# Patient Record
Sex: Male | Born: 1940 | Hispanic: Yes | Marital: Married | Smoking: Never smoker
Health system: Southern US, Community
[De-identification: ages and names within clinical notes are randomized; demographics above are authoritative.]

## PROBLEM LIST (undated history)

## (undated) DIAGNOSIS — N400 Enlarged prostate without lower urinary tract symptoms: Secondary | ICD-10-CM

## (undated) DIAGNOSIS — I10 Essential (primary) hypertension: Secondary | ICD-10-CM

## (undated) DIAGNOSIS — R350 Frequency of micturition: Secondary | ICD-10-CM

## (undated) DIAGNOSIS — R972 Elevated prostate specific antigen [PSA]: Secondary | ICD-10-CM

## (undated) DIAGNOSIS — R55 Syncope and collapse: Secondary | ICD-10-CM

## (undated) DIAGNOSIS — R42 Dizziness and giddiness: Secondary | ICD-10-CM

## (undated) HISTORY — DX: Frequency of micturition: R35.0

## (undated) HISTORY — DX: Syncope and collapse: R55

## (undated) HISTORY — DX: Essential (primary) hypertension: I10

## (undated) HISTORY — DX: Elevated prostate specific antigen (PSA): R97.20

## (undated) HISTORY — DX: Benign prostatic hyperplasia without lower urinary tract symptoms: N40.0

## (undated) HISTORY — DX: Dizziness and giddiness: R42

## (undated) HISTORY — PX: PROSTATE BIOPSY: SHX241

---

## 2010-08-30 HISTORY — PX: KNEE ASPIRATION: SHX1892

## 2010-08-30 HISTORY — PX: INGUINAL HERNIA REPAIR: SHX194

## 2010-08-30 HISTORY — PX: OTHER SURGICAL HISTORY: SHX169

## 2010-12-02 ENCOUNTER — Other Ambulatory Visit: Payer: Self-pay | Admitting: General Surgery

## 2010-12-02 ENCOUNTER — Encounter (HOSPITAL_COMMUNITY): Payer: Self-pay | Attending: General Surgery

## 2010-12-02 DIAGNOSIS — K4020 Bilateral inguinal hernia, without obstruction or gangrene, not specified as recurrent: Secondary | ICD-10-CM | POA: Insufficient documentation

## 2010-12-02 DIAGNOSIS — Z01812 Encounter for preprocedural laboratory examination: Secondary | ICD-10-CM | POA: Insufficient documentation

## 2010-12-02 LAB — CBC
HCT: 40.5 % (ref 39.0–52.0)
MCHC: 33.1 g/dL (ref 30.0–36.0)
MCV: 86.7 fL (ref 78.0–100.0)
Platelets: 276 10*3/uL (ref 150–400)
RDW: 13.7 % (ref 11.5–15.5)

## 2010-12-02 LAB — BASIC METABOLIC PANEL
BUN: 18 mg/dL (ref 6–23)
Calcium: 9.1 mg/dL (ref 8.4–10.5)
Creatinine, Ser: 0.85 mg/dL (ref 0.4–1.5)
GFR calc non Af Amer: >60 mL/min (ref >60)
Glucose, Bld: 129 mg/dL — ABNORMAL HIGH (ref 70–99)

## 2010-12-02 LAB — SURGICAL PCR SCREEN: Staphylococcus aureus: POSITIVE — AB

## 2010-12-08 ENCOUNTER — Ambulatory Visit (HOSPITAL_COMMUNITY)
Admission: RE | Admit: 2010-12-08 | Discharge: 2010-12-09 | Disposition: A | Discharge status: Home / Self Care | Payer: Self-pay | Source: Ambulatory Visit | Attending: General Surgery | Admitting: General Surgery

## 2010-12-08 DIAGNOSIS — K4020 Bilateral inguinal hernia, without obstruction or gangrene, not specified as recurrent: Secondary | ICD-10-CM | POA: Insufficient documentation

## 2010-12-23 NOTE — Op Note (Signed)
Charles Cantu, Charles Cantu           ACCOUNT NO.:  [id number]  MEDICAL RECORD NO.:  [id number]           PATIENT TYPE:  I  LOCATION:  A341                          FACILITY:  [facility]  PHYSICIAN:  Barbaraann Barthel, M.D. DATE OF BIRTH:  08-13-1941  DATE OF PROCEDURE:  12/08/2010 DATE OF DISCHARGE:                              OPERATIVE REPORT   SURGEON:  Barbaraann Barthel, MD  PREOPERATIVE DIAGNOSIS:  Bilateral inguinal hernias (direct).  POSTOPERATIVE DIAGNOSIS:  Bilateral inguinal hernias (direct).  PROCEDURE:  Bilateral inguinal herniorrhaphies (modified McVay repair without mesh).  WOUND CLASSIFICATION:  Clean.  SPECIMEN:  None.  NOTE:  This is a 69 year old [place] who presented with bilateral inguinal hernias.  We discussed repair, discussing complications not limited to but including bleeding, infection, and recurrence.  This was all explained to him in Spanish and informed consent was obtained.  PROCEDURE:  The patient was placed in the supine position after the adequate administration of spinal anesthesia.  A Foley catheter was aseptically inserted and his abdomen was prepped with Betadine solution and draped in the usual manner.  We started on the right side making an incision between the anterior-superior iliac spine and the pubic tubercle on the right side through skin, subcutaneous tissue and Scarpa's layer down to the external oblique, which was opened through the external ring.  The external oblique was then opened.  The cord structures were dissected free.  There was a direct hernia defect noted. There was no indirect component.  The cord structures were then dissected free from the hernia sac and the hernia sac was then invaginated within the peritoneal cavity, this was not opened and we then repaired the hernia suturing transversus abdominis and transversalis fascia to Cooper's ligament and Poupart's ligament using interrupted 2-0 Bralon sutures.  Prior to  cinching these, relaxing incision was carried out.  We then used 0.5% Sensorcaine to help with postoperative comfort and then returned the cord structures to their anatomic position and repaired the external oblique over the cord structures using a running 3-0 Polysorb suture.  The wound was irrigated with normal saline solution.  The skin was approximated with stapling device and isolated with a sterile OpSite dressing.  We then turned attention to the left side where pretty much the same procedure was repeated.  We did have a little larger hernia noted on this side.  We made an incision between the anterior-superior iliac spine and the pubic tubercle here through skin, subcutaneous tissue and Scarpa's layer through the external oblique, which was opened through the external ring.  The cord structures were then dissected free from the indirect hernia sac.  This was again invaginated within the abdominal cavity and then I closed the transversus abdominis and transversalis fascia suturing this to Cooper's ligament and Poupart's ligament repairing the hernia thusly.  Prior to cinching these tightly, a relaxing incision was carried out.  I had then used 0.5% Sensorcaine approximately 10 mL on this side as well to help with postoperative comfort.  The cord structures were returned to their anatomic position and the external oblique was repaired over them with a running 3-0 Polysorb suture.  The  subcu was irrigated with normal saline solution.  The skin was approximated with stapling device.  A sterile OpSite dressing was applied.  Prior to closure, all sponge, needle and instrument counts were found to be correct.  Estimated blood loss was minimal, less than 25 mL.  The patient received approximately 650 mL of crystalloids intraoperatively.  No drains were placed and there were no complications.  The patient was taken to the recovery room in satisfactory condition.  The Foley was left in  place because of spinal anesthesia and a history of BPH.  The patient will be admitted for observation.     Barbaraann Barthel, M.D.     WB/MEDQ  D:  12/08/2010  T:  12/08/2010  Job:  [id number]  Electronically Signed by Barbaraann Barthel M.D. on 12/23/2010 03:27:42 PM

## 2010-12-23 NOTE — Discharge Summary (Signed)
  NAMELONDEN, BOK           ACCOUNT NO.:  [id number]  MEDICAL RECORD NO.:  [id number]           PATIENT TYPE:  O  LOCATION:  A341                          FACILITY:  [facility]  PHYSICIAN:  Barbaraann Barthel, M.D. DATE OF BIRTH:  1940-12-25  DATE OF ADMISSION:  12/08/2010 DATE OF DISCHARGE:  04/11/2012LH                              DISCHARGE SUMMARY   DIAGNOSIS:  Bilateral inguinal hernias.  PROCEDURE:  On December 08, 2010, bilateral inguinal herniorrhaphies (modified McVay repair, no mesh used).  SECONDARY DIAGNOSIS:  Benign prostatic hypertrophy.  PROCEDURE NOTE:  This is a 70 year old [place] who presented with bilateral inguinal hernias.  We discussed repair with him in Spanish and planned for an outpatient at admission.  We took care of both of these hernias at the same time.  Postoperatively, he did very well.  He had some incisional discomfort, but his wound was clean.  He was voiding well.  He had no problems with his spinal anesthesia, and he was discharged the following day after a period of observation.  He was given a prescription for Colace 100 mg daily or every other day to take for avoiding straining of the bowels and Percocet 1 tablet 5/325 to take q.4-6 h. for pain.  He is to continue the rest of his medications and that he usually takes at home.  We will follow him up perioperatively. The family is instructed to contact me should he have any acute changes and we will follow up with him on December 16, 2010, at 10 a.m.  He is discharged on a regular diet.  He is told to clean his wound with alcohol and use an ice pack as needed for comfort and then he is told to do no heavy lifting greater than 5 pounds and he is told to refrain from going into the swimming pool or any Jacuzzis or anything or swimming until we remove his staples.  He is told to do no driving, no sexual activity, and to increase his activities as tolerated and he is excused from work.  We will  follow up with him and he is told to come to the emergency room or call me should there be any acute changes.     Barbaraann Barthel, M.D.     WB/MEDQ  D:  12/09/2010  T:  12/10/2010  Job:  [id number]  Electronically Signed by Barbaraann Barthel M.D. on 12/23/2010 03:26:52 PM

## 2011-07-13 ENCOUNTER — Encounter: Payer: Self-pay | Admitting: Orthopedic Surgery

## 2011-07-13 ENCOUNTER — Ambulatory Visit (INDEPENDENT_AMBULATORY_CARE_PROVIDER_SITE_OTHER): Payer: Self-pay | Admitting: Orthopedic Surgery

## 2011-07-13 VITALS — BP 120/70 | Ht 60.0 in | Wt 131.0 lb

## 2011-07-13 DIAGNOSIS — M009 Pyogenic arthritis, unspecified: Secondary | ICD-10-CM

## 2011-07-13 LAB — SYNOVIAL CELL COUNT + DIFF, W/ CRYSTALS
Crystals, Fluid: NONE SEEN
Lymphocytes-Synovial Fld: 5 % (ref 0–20)
Neutrophil, Synovial: 80 % — ABNORMAL HIGH (ref 0–25)
WBC, Synovial: 28500 cu mm — ABNORMAL HIGH (ref 0–200)

## 2011-07-13 MED ORDER — HYDROCODONE-ACETAMINOPHEN 5-325 MG PO TABS: 1.0000 | ORAL_TABLET | Freq: Four times a day (QID) | ORAL | Status: DC | PRN

## 2011-07-13 NOTE — Patient Instructions (Addendum)
APPLY ICE AS NEEDED   WEAR BRACE   TAKE MEDICATION   RETURN IN  2 DAYS

## 2011-07-15 ENCOUNTER — Encounter: Payer: Self-pay | Admitting: Orthopedic Surgery

## 2011-07-15 ENCOUNTER — Ambulatory Visit (INDEPENDENT_AMBULATORY_CARE_PROVIDER_SITE_OTHER): Payer: Self-pay | Admitting: Orthopedic Surgery

## 2011-07-15 VITALS — BP 120/62 | Ht 60.0 in | Wt 131.0 lb

## 2011-07-15 DIAGNOSIS — M009 Pyogenic arthritis, unspecified: Secondary | ICD-10-CM

## 2011-07-15 NOTE — Progress Notes (Signed)
Visit status post aspiration RIGHT knee  Cultures were obtained and cell count was also obtained showing approximately 30,000 white cells in the knee with gram-positive cocci but no growth x1 day  Patient was on antibiotics and his knee aspirated 2 days ago.  He comes in walking unsupported today with decrease in pain.  Clinically has decreased redness improved range of motion  He still has joint effusion and the aspiration was repeated.  Today his effusion is much clearer with clear yellow fluid and less fluid of only 30 cc.  Recommend continued antibiotics orally return on Monday or Tuesday for recheck of the knee and a final culture result.  Aspiration RIGHT knee Verbal consent and timeout were completed for a RIGHT knee aspiration  Under sterile conditions the RIGHT knee was aspirated from a lateral approach.  Findings were clear yellow fluid 30 cc  A sterile dressing was applied there were no complications

## 2011-07-15 NOTE — Patient Instructions (Signed)
CONTINUE ANTIBIOTICS  

## 2011-07-19 ENCOUNTER — Encounter: Payer: Self-pay | Admitting: Orthopedic Surgery

## 2011-07-19 DIAGNOSIS — M009 Pyogenic arthritis, unspecified: Secondary | ICD-10-CM | POA: Insufficient documentation

## 2011-07-19 NOTE — Progress Notes (Signed)
Chief complaint: right knee pain  HPI:(4) This patient was referred to me by Dr. Malvin Johns.  On Sunday, November 11 patient had a torn go into his RIGHT knee presents on the 12th with pain and swelling and is here today for increased pain swelling sharp dull throbbing pain 8/10 constant difficulty with ambulation.  He was started on doxycycline 100 mg q. Day for 14 days.  ROS:(2) Negative review of systems except for the muscle pain and swelling  PFSH: (1) History reviewed. No pertinent past medical history.   Physical Exam(12) GENERAL: normal development   CDV: pulses are normal   Skin: normal  Lymph: nodes were not palpable/normal  Psychiatric: awake, alert and oriented  Neuro: normal sensation  MSK He has an obvious limp 1  Large joint effusion, loss of full range of motion, knee stable.  Strength normal.  Puncture wound over the lateral joint. 2 Pulse and temperature normal 3 No evidence of lymphangitis or lymphadenopathy 4 Sensation normal 5 Reflexes are deferred 6 Balance is normal Imaging: Separate x-ray report for his knee effusion possible septic joint A low and a joint effusion is seen there is no evidence of fracture or dislocation  Impression joint effusion normal bone  Assessment: Septic arthritis    Plan: Aspiration, culture and cell count  Aspiration RIGHT knee Verbal consent and timeout were completed for a RIGHT knee aspiration  Under sterile conditions the RIGHT knee was aspirated from a lateral approach.  Findings were Cloudy fluid 60 cc A sterile dressing was applied there were no complications

## 2011-07-20 ENCOUNTER — Ambulatory Visit (INDEPENDENT_AMBULATORY_CARE_PROVIDER_SITE_OTHER): Payer: Self-pay | Admitting: Orthopedic Surgery

## 2011-07-20 ENCOUNTER — Encounter: Payer: Self-pay | Admitting: Orthopedic Surgery

## 2011-07-20 VITALS — BP 150/80 | Ht 60.0 in | Wt 131.0 lb

## 2011-07-20 DIAGNOSIS — M009 Pyogenic arthritis, unspecified: Secondary | ICD-10-CM

## 2011-07-20 MED ORDER — DOXYCYCLINE MONOHYDRATE 100 MG PO CAPS: 100.0000 mg | ORAL_CAPSULE | Freq: Two times a day (BID) | ORAL | Status: DC

## 2011-07-20 MED ORDER — HYDROCODONE-ACETAMINOPHEN 5-325 MG PO TABS: 1.0000 | ORAL_TABLET | Freq: Four times a day (QID) | ORAL | Status: AC | PRN

## 2011-07-20 NOTE — Patient Instructions (Signed)
RETURN 1 WEEK   TAKE ANTIBIOTIC 2 X A DAY

## 2011-07-20 NOTE — Progress Notes (Signed)
Followup status post aspiration of RIGHT knee for presumed septic arthritis. Cultures are negative after 3 days, presumed cellulitis with sympathetic effusion of the RIGHT knee. Patient complains of pain, increasing since Saturday on the medial side of the knee.  The patient also had a rash on the knee.  The RIGHT knee does have an effusion. He is painful extension and flexion, although passive range of motion was normal. The joint is stable. Muscle tone is excellent. Neurovascular exam is intact. Skin does show some red areas around the knee joint.  Aspiration of clear fluid began approximately 25 cc.  Continue doxycycline 100 mg twice a day and hydrocodone 5 mg q.6 as needed. Follow up one week to reevaluate the knee  Aspiration RIGHT knee Verbal consent and timeout were completed for a RIGHT knee aspiration  Under sterile conditions the RIGHT knee was aspirated from a lateral approach.  Findings were clear yellow fluid 30 cc  A sterile dressing was applied there were no complications

## 2011-07-27 ENCOUNTER — Encounter: Payer: Self-pay | Admitting: Orthopedic Surgery

## 2011-07-27 ENCOUNTER — Ambulatory Visit (INDEPENDENT_AMBULATORY_CARE_PROVIDER_SITE_OTHER): Payer: Self-pay | Admitting: Orthopedic Surgery

## 2011-07-27 VITALS — BP 110/70 | Ht 60.0 in | Wt 131.0 lb

## 2011-07-27 DIAGNOSIS — M009 Pyogenic arthritis, unspecified: Secondary | ICD-10-CM

## 2011-07-27 NOTE — Progress Notes (Signed)
Followup visit. RIGHT knee.  Status post multiple aspiration and culture negative, but presumed infection.  Currently, on doxycycline.  Now pain free.  Knee looks good today. No effusion. Full range of motion, normal ambulation without any evidence of  Recommend continue on antibiotics. Return to neutral. Recheck.

## 2011-07-27 NOTE — Patient Instructions (Signed)
Finish antibiotics

## 2011-08-10 ENCOUNTER — Ambulatory Visit (INDEPENDENT_AMBULATORY_CARE_PROVIDER_SITE_OTHER): Payer: Self-pay | Admitting: Orthopedic Surgery

## 2011-08-10 VITALS — Ht 60.0 in | Wt 131.0 lb

## 2011-08-10 DIAGNOSIS — M009 Pyogenic arthritis, unspecified: Secondary | ICD-10-CM

## 2011-08-10 NOTE — Patient Instructions (Signed)
As tolerated

## 2011-08-11 ENCOUNTER — Encounter: Payer: Self-pay | Admitting: Orthopedic Surgery

## 2011-08-11 NOTE — Progress Notes (Signed)
Follow-up, status post multiple aspirations and oral antibiotics. Patient is asymptomatic.  Exam shows no effusion. Full range of motion. The skin rash has resolved as well.  He is allowed to do normal activities.

## 2014-10-16 ENCOUNTER — Encounter: Payer: Self-pay | Admitting: Cardiology

## 2014-10-16 ENCOUNTER — Ambulatory Visit (INDEPENDENT_AMBULATORY_CARE_PROVIDER_SITE_OTHER): Payer: Self-pay | Admitting: Cardiology

## 2014-10-16 VITALS — BP 134/68 | HR 75 | Ht <= 58 in | Wt 141.2 lb

## 2014-10-16 DIAGNOSIS — R42 Dizziness and giddiness: Secondary | ICD-10-CM | POA: Insufficient documentation

## 2014-10-16 DIAGNOSIS — I10 Essential (primary) hypertension: Secondary | ICD-10-CM

## 2014-10-16 NOTE — Progress Notes (Signed)
Cardiology Office Note  Date: 10/16/2014   ID: Charles FoilHoracio Wardrop, DOB [birth year], MRN [id number]  PCP: Marlane HatcherBRADFORD,WILLIAM S, MD  Primary Cardiologist: Nona DellSamuel Merrit Waugh, MD   Chief Complaint  Patient presents with  . Dizziness    History of Present Illness: Charles Cantu is a 74 y.o. male referred for cardiology consultation by Dr. Malvin JohnsBradford. He is here today with a family member and also a [place] interpreter. He states that over the last month he has experienced episode of vertigo, specifically describing a spinning sensation, when he turns his head suddenly. This is most notable when he is lying down in bed and turns his head to the side quickly. It also occurs to milder degree when he bends over and stands back up quickly. At no point has he experienced any chest pain, palpitations, or syncope. Usually the symptoms last only for a few seconds. He does not recall any recent viral infections, no sinus congestion. He also states that he has a sudden, sharp pain in his years sometimes. Questionable popping sensation.  He has been on Norvasc which is a new medication in the last few weeks. He states that the symptoms have not been any worse on this medication, and preceded its initiation.  ECG done today is reviewed below. He is not aware of any personal cardiac history, no arrhythmias.  Orthostatic measurements made today were normal.   Past Medical History  Diagnosis Date  . Benign prostatic hypertrophy   . Essential hypertension     Past Surgical History  Procedure Laterality Date  . Cataract surgery Right 2012  . Inguinal hernia repair Bilateral 2012  . Knee aspiration Right 2012    Current Outpatient Prescriptions  Medication Sig Dispense Refill  . amLODipine (NORVASC) 5 MG tablet Take 5 mg by mouth daily.     No current facility-administered medications for this visit.    Allergies:  Review of patient's allergies indicates no known allergies.   Social History:  The patient  reports that he has never smoked. He does not have any smokeless tobacco history on file. He reports that he drinks alcohol. He reports that he does not use illicit drugs.   Family History: The patient's family history was reviewed and patient states of no major chronic medical conditions in first-degree relatives to the best that he can recall.  ROS:  Please see the history of present illness. Otherwise, complete review of systems is positive for none.  All other systems are reviewed and negative.    Physical Exam: VS:  BP 134/68 mmHg  Pulse 75  Ht 4\' 10"  (1.473 m)  Wt 141 lb 3.2 oz (64.048 kg)  BMI 29.52 kg/m2  SpO2 95%, BMI Body mass index is 29.52 kg/(m^2).  Wt Readings from Last 3 Encounters:  10/16/14 141 lb 3.2 oz (64.048 kg)  08/10/11 131 lb (59.421 kg)  07/27/11 131 lb (59.421 kg)     General: Patient appears comfortable at rest. HEENT: Conjunctiva and lids normal, oropharynx clear with moist mucosa. Neck: Supple, no elevated JVP or carotid bruits, no thyromegaly. Lungs: Clear to auscultation, nonlabored breathing at rest. Cardiac: Regular rate and rhythm, soft S4, no significant systolic murmur, no pericardial rub. Abdomen: Soft, nontender, bowel sounds present, no guarding or rebound. Extremities: No pitting edema, distal pulses 2+. Skin: Warm and dry. Musculoskeletal: No kyphosis. Neuropsychiatric: Alert and oriented x3, affect grossly appropriate.   ECG: ECG is ordered today and reviewed finding sinus rhythm with nonspecific ST changes and lead motion  artifact.  Recent Labwork:  Lab work from January showed potassium 4.1, BUN 15, creatinine 0.7, cholesterol 263, triglycerides 296, HDL 35, LDL 169, AST 71, ALT 96, TSH 2.7, hemoglobin 15.5, platelets 280.   ASSESSMENT AND PLAN:  1. Patient describing symptoms consistent with vertigo. Could perhaps be related to inner ear imbalance, viral labyrinthitis, or even benign positional vertigo. Onset was  approximately one month ago, he states that the symptoms are getting somewhat better. He has had no palpitations, chest pain, or syncope. He was not orthostatic on examination today, and there does not appear to be any specific correlation with recent initiation of Norvasc. I recommended over-the-counter meclizine as needed, keep follow-up with Dr. Malvin Johns. If symptoms persist or worsen, would recommend referral for ENT evaluation. Physical therapy maneuvers are sometimes helpful in improving persistent symptoms. No further cardiac testing arranged at this time.  2. Essential hypertension, recently started on Norvasc. Keep follow-up with Dr. Malvin Johns.  3. Hyperlipidemia, LDL 169 based on recent lab work. Currently not on any specific medical therapy. Keep follow-up with Dr. Malvin Johns.  4. Abnormal AST and ALT, etiology not certain. Patient reports only occasional alcohol use, no other known liver disease. Keep follow-up with Dr. Malvin Johns.  Current medicines are reviewed at length with the patient today.  The patient does not have concerns regarding medicines.   Orders Placed This Encounter  Procedures  . EKG 12-Lead    Disposition: FU with me as needed.   Signed, Jonelle Sidle, MD, [facility] 10/16/2014 11:11 AM    [facility] [address], [place], [place] [address] Phone: [phone]; Fax: [phone]

## 2014-10-16 NOTE — Patient Instructions (Signed)
Your physician recommends that you schedule a follow-up appointment As needed  Your physician recommends that you continue on your current medications as directed. Please refer to the Current Medication list given to you today.  Thank you for choosing [facility]!    

## 2014-10-18 ENCOUNTER — Encounter: Payer: Self-pay | Admitting: Cardiovascular Disease

## 2015-08-23 ENCOUNTER — Encounter (HOSPITAL_COMMUNITY): Payer: Self-pay | Admitting: Emergency Medicine

## 2015-08-23 ENCOUNTER — Emergency Department (HOSPITAL_COMMUNITY): Payer: Self-pay

## 2015-08-23 ENCOUNTER — Inpatient Hospital Stay (HOSPITAL_COMMUNITY)
Admission: EM | Admit: 2015-08-23 | Discharge: 2015-08-28 | DRG: 872 | Disposition: A | Discharge status: Home Health | Payer: Self-pay | Attending: Internal Medicine | Admitting: Internal Medicine

## 2015-08-23 DIAGNOSIS — B9620 Unspecified Escherichia coli [E. coli] as the cause of diseases classified elsewhere: Secondary | ICD-10-CM | POA: Diagnosis present

## 2015-08-23 DIAGNOSIS — Z1612 Extended spectrum beta lactamase (ESBL) resistance: Secondary | ICD-10-CM | POA: Diagnosis present

## 2015-08-23 DIAGNOSIS — R338 Other retention of urine: Secondary | ICD-10-CM | POA: Diagnosis present

## 2015-08-23 DIAGNOSIS — Z9079 Acquired absence of other genital organ(s): Secondary | ICD-10-CM

## 2015-08-23 DIAGNOSIS — A498 Other bacterial infections of unspecified site: Secondary | ICD-10-CM | POA: Diagnosis present

## 2015-08-23 DIAGNOSIS — N410 Acute prostatitis: Secondary | ICD-10-CM | POA: Diagnosis present

## 2015-08-23 DIAGNOSIS — N401 Enlarged prostate with lower urinary tract symptoms: Secondary | ICD-10-CM | POA: Diagnosis present

## 2015-08-23 DIAGNOSIS — A419 Sepsis, unspecified organism: Principal | ICD-10-CM | POA: Diagnosis present

## 2015-08-23 DIAGNOSIS — I10 Essential (primary) hypertension: Secondary | ICD-10-CM | POA: Diagnosis present

## 2015-08-23 DIAGNOSIS — N390 Urinary tract infection, site not specified: Secondary | ICD-10-CM | POA: Diagnosis present

## 2015-08-23 LAB — CBC WITH DIFFERENTIAL/PLATELET
BASOS ABS: 0 10*3/uL (ref 0.0–0.1)
Basophils Relative: 0 %
EOS ABS: 0.1 10*3/uL (ref 0.0–0.7)
EOS PCT: 1 %
HCT: 44.1 % (ref 39.0–52.0)
HEMOGLOBIN: 15 g/dL (ref 13.0–17.0)
LYMPHS ABS: 0.9 10*3/uL (ref 0.7–4.0)
Lymphocytes Relative: 5 %
MCH: 29.8 pg (ref 26.0–34.0)
MCHC: 34 g/dL (ref 30.0–36.0)
MCV: 87.7 fL (ref 78.0–100.0)
Monocytes Absolute: 0.9 10*3/uL (ref 0.1–1.0)
Monocytes Relative: 5 %
NEUTROS PCT: 89 %
Neutro Abs: 14.8 10*3/uL — ABNORMAL HIGH (ref 1.7–7.7)
PLATELETS: 223 10*3/uL (ref 150–400)
RBC: 5.03 MIL/uL (ref 4.22–5.81)
RDW: 13.6 % (ref 11.5–15.5)
WBC: 16.7 10*3/uL — AB (ref 4.0–10.5)

## 2015-08-23 MED ORDER — IBUPROFEN 100 MG/5ML PO SUSP
600.0000 mg | Freq: Once | ORAL | Status: AC
  Administered 2015-08-24: 600 mg via ORAL
  Filled 2015-08-23: qty 30

## 2015-08-23 MED ORDER — SODIUM CHLORIDE 0.9 % IV BOLUS (SEPSIS)
1000.0000 mL | Freq: Once | INTRAVENOUS | Status: AC
  Administered 2015-08-24: 1000 mL via INTRAVENOUS

## 2015-08-23 MED ORDER — FENTANYL CITRATE (PF) 100 MCG/2ML IJ SOLN
50.0000 ug | Freq: Once | INTRAMUSCULAR | Status: AC
  Administered 2015-08-24: 50 ug via INTRAVENOUS
  Filled 2015-08-23: qty 2

## 2015-08-23 NOTE — ED Notes (Signed)
Pt had prostate biopsy yesterday, started running a low grade fever today. C/O post op pain.

## 2015-08-23 NOTE — ED Provider Notes (Signed)
CSN: [id number]     Arrival date & time 08/23/15  2256 History  By signing my name below, I, Charles Cantu, attest that this documentation has been prepared under the direction and in the presence of Devoria Albe, MD. Electronically Signed: Angelene Giovanni, ED Scribe. 08/23/2015. 11:30 PM.    Chief Complaint  Patient presents with  . Post-op Problem   The history is provided by the patient. No language interpreter was used.   HPI Comments: Charles Cantu is a 74 y.o. male who presents to the Emergency Department complaining of post op pain onset today s/p prostate biopsy performed at [facility] yesterday, Dec 23rd. Pt reports associated low grade fever, chills today, pain with urination, rectal pain, and mild SOB. He adds that he had hematuria yesterday. He also reports associated gradually worsening waxing and weaning right sided CP onset this am, worse with palpation or ROM of his right arm. He denies any similar CP in the past. He denies lifting anything heavy recently or any change in activity. His daughter deny any problems during the biopsy when pt was laying on his left side. Pt was able to eat today and he took 2 Tylenol at 6 pm and Cipro today with mild relief of fever. He started cipro on Dec 22 and finished his last pill earlier tonight (total of 6 pills in 3 days). Pt is not currently on any prescription medicine. Marland Kitchen He denies any abdominal pain, n/v or cough. His daughter and son are here to interpret.   No PCP since Dr. Malvin Johns.  Pt only goes to [facility] for Urology.    Past Medical History  Diagnosis Date  . Benign prostatic hypertrophy   . Essential hypertension    Past Surgical History  Procedure Laterality Date  . Cataract surgery Right 2012  . Inguinal hernia repair Bilateral 2012  . Knee aspiration Right 2012  . Prostate biopsy     Family History  Problem Relation Age of Onset  . Family history unknown: Yes   Social History  Substance Use Topics  .  Smoking status: Never Smoker   . Smokeless tobacco: None  . Alcohol Use: 0.0 oz/week    0 Standard drinks or equivalent per week     Comment: Occasional beer  lives with sone  Review of Systems  Constitutional: Positive for fever and chills.  Respiratory: Positive for shortness of breath. Negative for cough.   Cardiovascular: Positive for chest pain.  Gastrointestinal: Positive for rectal pain. Negative for nausea, vomiting, abdominal pain and diarrhea.  Genitourinary: Positive for hematuria.  All other systems reviewed and are negative.     Allergies  Review of patient's allergies indicates no known allergies.  Home Medications   Prior to Admission medications   Medication Sig Start Date End Date Taking? Authorizing Provider  ciprofloxacin (CIPRO) 500 MG tablet Take 500 mg by mouth 3 (three) times daily. Completed on 08/23/2015-3 day course started 08/19/15    Historical Provider, MD   BP 147/81 mmHg  Pulse 100  Temp(Src) 100 F (37.8 C) (Oral)  Resp 20  Ht 5' (1.524 m)  Wt 139 lb (63.05 kg)  BMI 27.15 kg/m2  SpO2 96%  Vital signs normal except low grade fever, borderline tachycardia  Physical Exam  Constitutional: He is oriented to person, place, and time. He appears well-developed and well-nourished.  Non-toxic appearance. He does not appear ill. No distress.  HENT:  Head: Normocephalic and atraumatic.  Right Ear: External ear normal.  Left Ear:  External ear normal.  Nose: Nose normal. No mucosal edema or rhinorrhea.  Mouth/Throat: Oropharynx is clear and moist and mucous membranes are normal. No dental abscesses or uvula swelling.  Eyes: Conjunctivae and EOM are normal. Pupils are equal, round, and reactive to light.  Neck: Normal range of motion and full passive range of motion without pain. Neck supple.  Cardiovascular: Normal rate, regular rhythm and normal heart sounds.  Exam reveals no gallop and no friction rub.   No murmur heard. Pulmonary/Chest: Effort  normal and breath sounds normal. No respiratory distress. He has no wheezes. He has no rhonchi. He has no rales. He exhibits tenderness. He exhibits no crepitus.    Tender especially the right superior lateral chest, very tender to touch.   Abdominal: Soft. Normal appearance and bowel sounds are normal. He exhibits no distension. There is no tenderness. There is no rebound and no guarding.  Musculoskeletal: Normal range of motion. He exhibits no edema or tenderness.  Moves all extremities well.   Neurological: He is alert and oriented to person, place, and time. He has normal strength. No cranial nerve deficit.  Skin: Skin is warm, dry and intact. No rash noted. No erythema. No pallor.  Psychiatric: He has a normal mood and affect. His speech is normal and behavior is normal. His mood appears not anxious.  Nursing note and vitals reviewed.   ED Course  Procedures (including critical care time)  Medications  0.9 %  sodium chloride infusion ( Intravenous New Bag/Given 08/24/15 0331)  morphine 2 MG/ML injection 2-4 mg (not administered)  heparin injection 5,000 Units (not administered)  sodium chloride 0.9 % injection 3 mL (3 mLs Intravenous Not Given 08/24/15 0331)  piperacillin-tazobactam (ZOSYN) IVPB 3.375 g (3.375 g Intravenous Given 08/24/15 0332)  sodium chloride 0.9 % bolus 1,000 mL (0 mLs Intravenous Stopped 08/24/15 0144)  fentaNYL (SUBLIMAZE) injection 50 mcg (50 mcg Intravenous Given 08/24/15 0030)  ibuprofen (ADVIL,MOTRIN) 100 MG/5ML suspension 600 mg (600 mg Oral Given 08/24/15 0027)  iohexol (OMNIPAQUE) 300 MG/ML solution 100 mL (100 mLs Intravenous Contrast Given 08/24/15 0012)  cefTRIAXone (ROCEPHIN) 1 g in dextrose 5 % 50 mL IVPB (0 g Intravenous Stopped 08/24/15 0139)  cefTRIAXone (ROCEPHIN) 1 g in dextrose 5 % 50 mL IVPB (0 g Intravenous Stopped 08/24/15 0139)    DIAGNOSTIC STUDIES: Oxygen Saturation is 96% on RA, adequate by my interpretation.    COORDINATION OF  CARE: 11:30 PM- Pt advised of plan for treatment and pt agrees. Pt will provide urine sample for further evaluation. Pt will receive chest x-ray, CT AP,  and lab work. He will also receive pain medication through IV.   After reviewing his urine results, he was started on rocephin for sepsis urinary source, zithromax added b/o possible pneumonia on CXR.   Discussed test results with patient and his sons, agreeable for admission.   01:40 Dr Julian ReilGardner, here in ED, will admit    Labs Review Results for orders placed or performed during the hospital encounter of 08/23/15  Culture, blood (routine x 2)  Result Value Ref Range   Specimen Description BLOOD RIGHT ANTECUBITAL    Special Requests      BOTTLES DRAWN AEROBIC AND ANAEROBIC AEB 3CC ANA 2CC   Culture PENDING    Report Status PENDING   Culture, blood (routine x 2)  Result Value Ref Range   Specimen Description BLOOD RIGHT ARM    Special Requests BOTTLES DRAWN AEROBIC ONLY 2CC    Culture PENDING  Report Status PENDING   Urinalysis, Routine w reflex microscopic  Result Value Ref Range   Color, Urine YELLOW YELLOW   APPearance HAZY (A) CLEAR   Specific Gravity, Urine 1.025 1.005 - 1.030   pH 6.0 5.0 - 8.0   Glucose, UA 100 (A) NEGATIVE mg/dL   Hgb urine dipstick LARGE (A) NEGATIVE   Bilirubin Urine NEGATIVE NEGATIVE   Ketones, ur TRACE (A) NEGATIVE mg/dL   Protein, ur TRACE (A) NEGATIVE mg/dL   Nitrite NEGATIVE NEGATIVE   Leukocytes, UA SMALL (A) NEGATIVE  Comprehensive metabolic panel  Result Value Ref Range   Sodium 133 (L) 135 - 145 mmol/L   Potassium 3.8 3.5 - 5.1 mmol/L   Chloride 98 (L) 101 - 111 mmol/L   CO2 25 22 - 32 mmol/L   Glucose, Bld 173 (H) 65 - 99 mg/dL   BUN 16 6 - 20 mg/dL   Creatinine, Ser 6.04 0.61 - 1.24 mg/dL   Calcium 9.1 8.9 - 54.0 mg/dL   Total Protein 7.7 6.5 - 8.1 g/dL   Albumin 4.4 3.5 - 5.0 g/dL   AST 34 15 - 41 U/L   ALT 29 17 - 63 U/L   Alkaline Phosphatase 81 38 - 126 U/L   Total  Bilirubin 0.8 0.3 - 1.2 mg/dL   GFR calc non Af Amer >60 >60 mL/min   GFR calc Af Amer >60 >60 mL/min   Anion gap 10 5 - 15  CBC with Differential  Result Value Ref Range   WBC 16.7 (H) 4.0 - 10.5 K/uL   RBC 5.03 4.22 - 5.81 MIL/uL   Hemoglobin 15.0 13.0 - 17.0 g/dL   HCT 98.1 19.1 - 47.8 %   MCV 87.7 78.0 - 100.0 fL   MCH 29.8 26.0 - 34.0 pg   MCHC 34.0 30.0 - 36.0 g/dL   RDW 29.5 62.1 - 30.8 %   Platelets 223 150 - 400 K/uL   Neutrophils Relative % 89 %   Neutro Abs 14.8 (H) 1.7 - 7.7 K/uL   Lymphocytes Relative 5 %   Lymphs Abs 0.9 0.7 - 4.0 K/uL   Monocytes Relative 5 %   Monocytes Absolute 0.9 0.1 - 1.0 K/uL   Eosinophils Relative 1 %   Eosinophils Absolute 0.1 0.0 - 0.7 K/uL   Basophils Relative 0 %   Basophils Absolute 0.0 0.0 - 0.1 K/uL  Troponin I  Result Value Ref Range   Troponin I <0.03 <0.031 ng/mL  Urine microscopic-add on  Result Value Ref Range   Squamous Epithelial / LPF 0-5 (A) NONE SEEN   WBC, UA TOO NUMEROUS TO COUNT 0 - 5 WBC/hpf   RBC / HPF TOO NUMEROUS TO COUNT 0 - 5 RBC/hpf   Bacteria, UA MANY (A) NONE SEEN   Laboratory interpretation all normal except leukocytosis, hyperglycemia     Imaging Review Dg Chest 2 View  08/24/2015  CLINICAL DATA:  Acute onset of right-sided chest pain and mild shortness of breath. Initial encounter. EXAM: CHEST  2 VIEW COMPARISON:  None. FINDINGS: The lungs are well-aerated. Mild bibasilar opacities may reflect atelectasis or mild pneumonia. There is no evidence of pleural effusion or pneumothorax. The heart is normal in size; the mediastinal contour is within normal limits. No acute osseous abnormalities are seen. IMPRESSION: Mild bibasilar opacities may reflect atelectasis or mild pneumonia. Electronically Signed   By: Roanna Raider M.D.   On: 08/24/2015 00:23     Ct Abdomen Pelvis W Contrast  08/24/2015  CLINICAL DATA:  Acute onset of low grade fever, chills, dysuria, rectal pain and mild shortness of breath.  Hematuria, status post prostate biopsy yesterday. Initial encounter. EXAM: CT ABDOMEN AND PELVIS WITH CONTRAST TECHNIQUE: Multidetector CT imaging of the abdomen and pelvis was performed using the standard protocol following bolus administration of intravenous contrast. CONTRAST:  OMNIPAQUE IOHEXOL 300 MG/ML  SOLN COMPARISON:  None. FINDINGS: Mild bibasilar atelectasis is noted.  A tiny hiatal hernia is noted. The liver and spleen are unremarkable in appearance. The gallbladder is within normal limits. The pancreas and adrenal glands are unremarkable. A 2.7 cm cyst is noted at the lower pole of the left kidney. There is no evidence of hydronephrosis. No renal or ureteral stones are seen. Mild nonspecific left-sided perinephric stranding is seen. No free fluid is identified. The small bowel is unremarkable in appearance. The stomach is within normal limits. No acute vascular abnormalities are seen. Mild scattered calcification is noted along the abdominal aorta and its branches. The appendix is normal in caliber, without evidence of appendicitis. The colon is unremarkable in appearance. The bladder is mildly distended and grossly unremarkable. The prostate is enlarged, measuring up to 6.0 cm in AP dimension, with diffuse heterogeneity, likely reflecting recent biopsy. A small left inguinal hernia is seen, containing only fat. No inguinal lymphadenopathy is seen. No acute osseous abnormalities are identified. IMPRESSION: 1. No acute abnormality seen within the abdomen or pelvis. 2. Enlarged prostate, measuring up to 6.0 cm in AP dimension, with diffuse heterogeneity, likely reflecting recent biopsy. 3. Tiny hiatal hernia noted. 4. Small left inguinal hernia, containing only fat. 5. Mild bibasilar atelectasis noted. 6. Small left renal cyst seen. 7. Mild scattered calcification along the abdominal aorta and its branches. Electronically Signed   By: Roanna Raider M.D.   On: 08/24/2015 00:43      Devoria Albe, MD  has personally reviewed and evaluated these images and lab results as part of her medical decision-making.   EKG Interpretation   Date/Time:  Saturday August 23 2015 23:46:07 EST Ventricular Rate:  101 PR Interval:  115 QRS Duration: 92 QT Interval:  334 QTC Calculation: 433 R Axis:   99 Text Interpretation:  Sinus tachycardia Atrial premature complex Inferior  infarct, age indeterminate Baseline wander in lead(s) V4 Electrode noise  Since last tracing rate faster (02 Dec 2010) Confirmed by [facility]  MD-I, Cyndra Feinberg  ([id number]) on 08/23/2015 11:51:40 PM      MDM   Final diagnoses:  Febrile urinary tract infection   Plan admission  Devoria Albe, MD, FACEP   I personally performed the services described in this documentation, which was scribed in my presence. The recorded information has been reviewed and considered.  Devoria Albe, MD, Concha Pyo, MD 08/24/15 [id number]

## 2015-08-24 DIAGNOSIS — N390 Urinary tract infection, site not specified: Secondary | ICD-10-CM | POA: Diagnosis present

## 2015-08-24 DIAGNOSIS — N410 Acute prostatitis: Secondary | ICD-10-CM | POA: Diagnosis present

## 2015-08-24 DIAGNOSIS — A419 Sepsis, unspecified organism: Secondary | ICD-10-CM | POA: Diagnosis present

## 2015-08-24 LAB — URINALYSIS, ROUTINE W REFLEX MICROSCOPIC
BILIRUBIN URINE: NEGATIVE
Glucose, UA: 100 mg/dL — AB
NITRITE: NEGATIVE
SPECIFIC GRAVITY, URINE: 1.025 (ref 1.005–1.030)
pH: 6 (ref 5.0–8.0)

## 2015-08-24 LAB — COMPREHENSIVE METABOLIC PANEL
ALBUMIN: 4.4 g/dL (ref 3.5–5.0)
ALK PHOS: 81 U/L (ref 38–126)
ALT: 29 U/L (ref 17–63)
ANION GAP: 10 (ref 5–15)
AST: 34 U/L (ref 15–41)
BUN: 16 mg/dL (ref 6–20)
CALCIUM: 9.1 mg/dL (ref 8.9–10.3)
CO2: 25 mmol/L (ref 22–32)
Chloride: 98 mmol/L — ABNORMAL LOW (ref 101–111)
Creatinine, Ser: 0.89 mg/dL (ref 0.61–1.24)
GFR calc Af Amer: >60 mL/min (ref >60)
GFR calc non Af Amer: >60 mL/min (ref >60)
GLUCOSE: 173 mg/dL — AB (ref 65–99)
Potassium: 3.8 mmol/L (ref 3.5–5.1)
SODIUM: 133 mmol/L — AB (ref 135–145)
Total Bilirubin: 0.8 mg/dL (ref 0.3–1.2)
Total Protein: 7.7 g/dL (ref 6.5–8.1)

## 2015-08-24 LAB — URINE MICROSCOPIC-ADD ON

## 2015-08-24 LAB — TROPONIN I: Troponin I: <0.03 ng/mL (ref <0.031)

## 2015-08-24 MED ORDER — PIPERACILLIN-TAZOBACTAM 3.375 G IVPB
3.3750 g | Freq: Once | INTRAVENOUS | Status: AC
  Administered 2015-08-24: 3.375 g via INTRAVENOUS
  Filled 2015-08-24: qty 50

## 2015-08-24 MED ORDER — SODIUM CHLORIDE 0.9 % IJ SOLN
3.0000 mL | Freq: Two times a day (BID) | INTRAMUSCULAR | Status: DC
  Administered 2015-08-25: 3 mL via INTRAVENOUS

## 2015-08-24 MED ORDER — HEPARIN SODIUM (PORCINE) 5000 UNIT/ML IJ SOLN
5000.0000 [IU] | Freq: Three times a day (TID) | INTRAMUSCULAR | Status: DC
  Administered 2015-08-24 – 2015-08-28 (×13): 5000 [IU] via SUBCUTANEOUS
  Filled 2015-08-24 (×13): qty 1

## 2015-08-24 MED ORDER — DEXTROSE 5 % IV SOLN
1.0000 g | Freq: Once | INTRAVENOUS | Status: AC
  Administered 2015-08-24: 1 g via INTRAVENOUS
  Filled 2015-08-24: qty 10

## 2015-08-24 MED ORDER — SODIUM CHLORIDE 0.9 % IV SOLN
INTRAVENOUS | Status: DC
  Administered 2015-08-24 – 2015-08-28 (×9): via INTRAVENOUS

## 2015-08-24 MED ORDER — IOHEXOL 300 MG/ML  SOLN
100.0000 mL | Freq: Once | INTRAMUSCULAR | Status: AC | PRN
  Administered 2015-08-24: 100 mL via INTRAVENOUS

## 2015-08-24 MED ORDER — PIPERACILLIN-TAZOBACTAM 3.375 G IVPB
3.3750 g | Freq: Three times a day (TID) | INTRAVENOUS | Status: DC
  Administered 2015-08-24 – 2015-08-28 (×12): 3.375 g via INTRAVENOUS
  Filled 2015-08-24 (×16): qty 50

## 2015-08-24 MED ORDER — AZITHROMYCIN 500 MG IV SOLR
500.0000 mg | INTRAVENOUS | Status: DC
  Filled 2015-08-24: qty 500

## 2015-08-24 MED ORDER — CEFTRIAXONE SODIUM 1 G IJ SOLR
1.0000 g | Freq: Once | INTRAMUSCULAR | Status: AC
  Administered 2015-08-24: 1 g via INTRAVENOUS
  Filled 2015-08-24: qty 10

## 2015-08-24 MED ORDER — MORPHINE SULFATE (PF) 2 MG/ML IV SOLN
2.0000 mg | INTRAVENOUS | Status: DC | PRN
Start: 2015-08-24 — End: 2015-08-28
  Administered 2015-08-24 – 2015-08-26 (×4): 2 mg via INTRAVENOUS
  Filled 2015-08-24 (×2): qty 1
  Filled 2015-08-24: qty 2
  Filled 2015-08-24 (×2): qty 1

## 2015-08-24 MED ORDER — ACETAMINOPHEN 325 MG PO TABS
650.0000 mg | ORAL_TABLET | Freq: Four times a day (QID) | ORAL | Status: DC | PRN
  Administered 2015-08-24 – 2015-08-25 (×3): 650 mg via ORAL
  Filled 2015-08-24 (×3): qty 2

## 2015-08-24 NOTE — Progress Notes (Signed)
ANTIBIOTIC CONSULT NOTE-Preliminary  Pharmacy Consult for Zosyn Indication: post-op prostatitis, UTI  No Known Allergies  Patient Measurements: Height: 5' (152.4 cm) Weight: 139 lb (63.05 kg) IBW/kg (Calculated) : 50   Vital Signs: Temp: 100 F (37.8 C) (12/24 2300) Temp Source: Oral (12/24 2300) BP: 110/63 mmHg (12/25 0245) Pulse Rate: 95 (12/25 0245)  Labs:  Recent Labs  08/23/15 2335  WBC 16.7*  HGB 15.0  PLT 223  CREATININE 0.89    Estimated Creatinine Clearance: 56.9 mL/min (by C-G formula based on Cr of 0.89).  No results for input(s): VANCOTROUGH, VANCOPEAK, VANCORANDOM, GENTTROUGH, GENTPEAK, GENTRANDOM, TOBRATROUGH, TOBRAPEAK, TOBRARND, AMIKACINPEAK, AMIKACINTROU, AMIKACIN in the last 72 hours.   Microbiology: Recent Results (from the past 720 hour(s))  Culture, blood (routine x 2)     Status: None (Preliminary result)   Collection Time: 08/24/15 12:24 AM  Result Value Ref Range Status   Specimen Description BLOOD RIGHT ANTECUBITAL  Final   Special Requests   Final    BOTTLES DRAWN AEROBIC AND ANAEROBIC AEB 3CC ANA 2CC   Culture PENDING  Incomplete   Report Status PENDING  Incomplete  Culture, blood (routine x 2)     Status: None (Preliminary result)   Collection Time: 08/24/15 12:32 AM  Result Value Ref Range Status   Specimen Description BLOOD RIGHT ARM  Final   Special Requests BOTTLES DRAWN AEROBIC ONLY 2CC  Final   Culture PENDING  Incomplete   Report Status PENDING  Incomplete    Medical History: Past Medical History  Diagnosis Date  . Benign prostatic hypertrophy   . Essential hypertension     Medications:  Scheduled:  . heparin  5,000 Units Subcutaneous 3 times per day  . sodium chloride  3 mL Intravenous Q12H    Assessment: 74 yo male [facility] presented with fever, SOB, hematuria then dysuria, and rectal pain. Starting Zosyn, may add vanc if Ucx gram stain + GPC.  Goal of Therapy:   eradication of infection  Plan:  Preliminary review of pertinent patient information completed.  Protocol will be initiated with a one-time dose(s) of Zosyn 3.375 grams.  Jeani HawkingAnnie Penn clinical pharmacist will complete review during morning rounds to assess patient and finalize treatment regimen.  Nissan Frazzini Scarlett, RPH 08/24/2015,2:50 AM

## 2015-08-24 NOTE — H&P (Addendum)
[facility] History and Physical  Charles Cantu JXB:[id number] DOB: May 05, 1941 DOA: 08/23/2015  Referring physician: EDP PCP: No PCP Per Patient   Chief Complaint: Fever, rectal pain   HPI: Charles Cantu is a 74 y.o. male who is post op of a prostate biopsy that he had done at [facility] on the 23rd.  He was put on prophylactic cipro on discharge.  He states that yesterday he had some hematuria that has resolved today.  But today he developed fever, dysuria, rectal pain, and mild SOB.  Finished his last cipro pill earlier tonight.  Tylenol at home produced mild relief of fever.  Review of Systems: denies cough, Systems reviewed.  As above, otherwise negative  Past Medical History  Diagnosis Date  . Benign prostatic hypertrophy   . Essential hypertension    Past Surgical History  Procedure Laterality Date  . Cataract surgery Right 2012  . Inguinal hernia repair Bilateral 2012  . Knee aspiration Right 2012  . Prostate biopsy     Social History:  reports that he has never smoked. He does not have any smokeless tobacco history on file. He reports that he drinks alcohol. He reports that he does not use illicit drugs.  No Known Allergies  Family History  Problem Relation Age of Onset  . Family history unknown: Yes     Prior to Admission medications   Medication Sig Start Date End Date Taking? Authorizing Provider  ciprofloxacin (CIPRO) 500 MG tablet Take 500 mg by mouth 3 (three) times daily. Completed on 08/23/2015-3 day course started 08/19/15    Historical Provider, MD   Physical Exam: Filed Vitals:   08/24/15 0145 08/24/15 0200  BP: 121/69 130/72  Pulse: 101 104  Temp:    Resp: 23 25    BP 130/72 mmHg  Pulse 104  Temp(Src) 100 F (37.8 C) (Oral)  Resp 25  Ht 5' (1.524 m)  Wt 63.05 kg (139 lb)  BMI 27.15 kg/m2  SpO2 94%  General Appearance:    Alert, oriented, no distress, appears stated age  Head:    Normocephalic, atraumatic  Eyes:     PERRL, EOMI, sclera non-icteric        Nose:   Nares without drainage or epistaxis. Mucosa, turbinates normal  Throat:   Moist mucous membranes. Oropharynx without erythema or exudate.  Neck:   Supple. No carotid bruits.  No thyromegaly.  No lymphadenopathy.   Back:     No CVA tenderness, no spinal tenderness  Lungs:     Clear to auscultation bilaterally, without wheezes, rhonchi or rales  Chest wall:    No tenderness to palpitation  Heart:    Regular rate and rhythm without murmurs, gallops, rubs  Abdomen:     Soft, non-tender, nondistended, normal bowel sounds, no organomegaly  Genitalia:    deferred  Rectal:    deferred  Extremities:   No clubbing, cyanosis or edema.  Pulses:   2+ and symmetric all extremities  Skin:   Skin color, texture, turgor normal, no rashes or lesions  Lymph nodes:   Cervical, supraclavicular, and axillary nodes normal  Neurologic:   CNII-XII intact. Normal strength, sensation and reflexes      throughout    Labs on Admission:  Basic Metabolic Panel:  Recent Labs Lab 08/23/15 2335  NA 133*  K 3.8  CL 98*  CO2 25  GLUCOSE 173*  BUN 16  CREATININE 0.89  CALCIUM 9.1   Liver Function Tests:  Recent Labs Lab 08/23/15  2335  AST 34  ALT 29  ALKPHOS 81  BILITOT 0.8  PROT 7.7  ALBUMIN 4.4   No results for input(s): LIPASE, AMYLASE in the last 168 hours. No results for input(s): AMMONIA in the last 168 hours. CBC:  Recent Labs Lab 08/23/15 2335  WBC 16.7*  NEUTROABS 14.8*  HGB 15.0  HCT 44.1  MCV 87.7  PLT 223   Cardiac Enzymes:  Recent Labs Lab 08/23/15 2335  TROPONINI <0.03    BNP (last 3 results) No results for input(s): PROBNP in the last 8760 hours. CBG: No results for input(s): GLUCAP in the last 168 hours.  Radiological Exams on Admission: Dg Chest 2 View  08/24/2015  CLINICAL DATA:  Acute onset of right-sided chest pain and mild shortness of breath. Initial encounter. EXAM: CHEST  2 VIEW COMPARISON:  None. FINDINGS:  The lungs are well-aerated. Mild bibasilar opacities may reflect atelectasis or mild pneumonia. There is no evidence of pleural effusion or pneumothorax. The heart is normal in size; the mediastinal contour is within normal limits. No acute osseous abnormalities are seen. IMPRESSION: Mild bibasilar opacities may reflect atelectasis or mild pneumonia. Electronically Signed   By: Roanna Raider M.D.   On: 08/24/2015 00:23   Ct Abdomen Pelvis W Contrast  08/24/2015  CLINICAL DATA:  Acute onset of low grade fever, chills, dysuria, rectal pain and mild shortness of breath. Hematuria, status post prostate biopsy yesterday. Initial encounter. EXAM: CT ABDOMEN AND PELVIS WITH CONTRAST TECHNIQUE: Multidetector CT imaging of the abdomen and pelvis was performed using the standard protocol following bolus administration of intravenous contrast. CONTRAST:  OMNIPAQUE IOHEXOL 300 MG/ML  SOLN COMPARISON:  None. FINDINGS: Mild bibasilar atelectasis is noted.  A tiny hiatal hernia is noted. The liver and spleen are unremarkable in appearance. The gallbladder is within normal limits. The pancreas and adrenal glands are unremarkable. A 2.7 cm cyst is noted at the lower pole of the left kidney. There is no evidence of hydronephrosis. No renal or ureteral stones are seen. Mild nonspecific left-sided perinephric stranding is seen. No free fluid is identified. The small bowel is unremarkable in appearance. The stomach is within normal limits. No acute vascular abnormalities are seen. Mild scattered calcification is noted along the abdominal aorta and its branches. The appendix is normal in caliber, without evidence of appendicitis. The colon is unremarkable in appearance. The bladder is mildly distended and grossly unremarkable. The prostate is enlarged, measuring up to 6.0 cm in AP dimension, with diffuse heterogeneity, likely reflecting recent biopsy. A small left inguinal hernia is seen, containing only fat. No inguinal  lymphadenopathy is seen. No acute osseous abnormalities are identified. IMPRESSION: 1. No acute abnormality seen within the abdomen or pelvis. 2. Enlarged prostate, measuring up to 6.0 cm in AP dimension, with diffuse heterogeneity, likely reflecting recent biopsy. 3. Tiny hiatal hernia noted. 4. Small left inguinal hernia, containing only fat. 5. Mild bibasilar atelectasis noted. 6. Small left renal cyst seen. 7. Mild scattered calcification along the abdominal aorta and its branches. Electronically Signed   By: Roanna Raider M.D.   On: 08/24/2015 00:43    EKG: Independently reviewed.  Assessment/Plan Active Problems:   Febrile urinary tract infection   Prostatitis, acute   Sepsis (HCC)   1. Post-op UTI / prostatitis causing mild sepsis- 1. Dr. Annabell Howells (urologist) just happens to be conveniently sitting across from me down here in the ED and available for a quick informal consult discussion. 1. Post op infections can occur  frequently in this setting and apparently can deteriorate fast 2. He recommends putting patient on zosyn at a minimum since what ever he has is not sensitive to cipro 2. Gram stain of urine ordered as well add vanc if GPC are found, but overall not really any risk factors for MRSA colonization in this patient.  Would also add empiric vanc if patient starts getting worse. 3. IVF 4. Tele monitor 5. Urine and blood Cx are pending 6. Morphine PRN pain    Code Status: Full Code  Family Communication: Family at bedside Disposition Plan: Admit to inpatient   Time spent: 70 min  Tanaia Hawkey M. [facility] Pager [phone]  If 7AM-7PM, please contact the day team taking care of the patient [email or web address] Password [email or web address] 08/24/2015, 2:14 AM

## 2015-08-24 NOTE — Progress Notes (Signed)
[facility] PROGRESS NOTE   Donavan FoilHoracio Hirata ZOX:[id number] DOB: 08/27/1941 DOA: 08/23/2015 PCP: No PCP Per Patient  HPI/Subjective: Patient seen with 3 family members at bedside. They helped to translate between [place] and [place]. Patient feels weak and aches and pains all over.  Assessment/Plan: Active Problems:   Febrile urinary tract infection   Prostatitis, acute   Sepsis [facility](HCC)    Patient seen and examined earlier today by my colleague Dr. Julian ReilGardner. Data base reviewed, I have seen and examined the patient. Acute prostatitis and UTI. Sepsis syndrome with the criteria WBC of 16.7 and heart rate of 102 and presence of infection (acute prostatitis/UTI) Aggressively hydrated with IV fluids, Zosyn. Await urine culture, if continues to have fever add vancomycin.   Code Status: Full Code Family Communication: Plan discussed with the patient. Disposition Plan: Remains inpatient Diet: Diet Heart Room service appropriate?: Yes; Fluid consistency:: Thin  Consultants:  None  Procedures:  None  Antibiotics:  None   Objective: Filed Vitals:   08/24/15 0608 08/24/15 1026  BP: 108/52   Pulse: 82   Temp: 98.4 F (36.9 C) 100.2 F (37.9 C)  Resp: 17     Intake/Output Summary (Last 24 hours) at 08/24/15 1040 Last data filed at 08/24/15 0800  Gross per 24 hour  Intake 542.92 ml  Output      0 ml  Net 542.92 ml   Filed Weights   08/23/15 2300 08/24/15 0317  Weight: 63.05 kg (139 lb) 63.821 kg (140 lb 11.2 oz)    Exam: General: Alert and awake, oriented x3, not in any acute distress. HEENT: anicteric sclera, pupils reactive to light and accommodation, EOMI CVS: S1-S2 clear, no murmur rubs or gallops Chest: clear to auscultation bilaterally, no wheezing, rales or rhonchi Abdomen: soft nontender, nondistended, normal bowel sounds, no organomegaly Extremities: no cyanosis, clubbing or edema noted bilaterally Neuro: Cranial nerves II-XII intact, no focal  neurological deficits  Data Reviewed: Basic Metabolic Panel:  Recent Labs Lab 08/23/15 2335  NA 133*  K 3.8  CL 98*  CO2 25  GLUCOSE 173*  BUN 16  CREATININE 0.89  CALCIUM 9.1   Liver Function Tests:  Recent Labs Lab 08/23/15 2335  AST 34  ALT 29  ALKPHOS 81  BILITOT 0.8  PROT 7.7  ALBUMIN 4.4   No results for input(s): LIPASE, AMYLASE in the last 168 hours. No results for input(s): AMMONIA in the last 168 hours. CBC:  Recent Labs Lab 08/23/15 2335  WBC 16.7*  NEUTROABS 14.8*  HGB 15.0  HCT 44.1  MCV 87.7  PLT 223   Cardiac Enzymes:  Recent Labs Lab 08/23/15 2335  TROPONINI <0.03   BNP (last 3 results) No results for input(s): BNP in the last 8760 hours.  ProBNP (last 3 results) No results for input(s): PROBNP in the last 8760 hours.  CBG: No results for input(s): GLUCAP in the last 168 hours.  Micro Recent Results (from the past 240 hour(s))  Culture, blood (routine x 2)     Status: None (Preliminary result)   Collection Time: 08/24/15 12:24 AM  Result Value Ref Range Status   Specimen Description BLOOD RIGHT ANTECUBITAL  Final   Special Requests   Final    BOTTLES DRAWN AEROBIC AND ANAEROBIC AEB 3CC ANA 2CC   Culture NO GROWTH < 12 HOURS  Final   Report Status PENDING  Incomplete  Culture, blood (routine x 2)     Status: None (Preliminary result)   Collection Time: 08/24/15 12:32 AM  Result Value Ref Range Status   Specimen Description BLOOD RIGHT ARM  Final   Special Requests BOTTLES DRAWN AEROBIC ONLY 2CC  Final   Culture NO GROWTH < 12 HOURS  Final   Report Status PENDING  Incomplete     Studies: Dg Chest 2 View  08/24/2015  CLINICAL DATA:  Acute onset of right-sided chest pain and mild shortness of breath. Initial encounter. EXAM: CHEST  2 VIEW COMPARISON:  None. FINDINGS: The lungs are well-aerated. Mild bibasilar opacities may reflect atelectasis or mild pneumonia. There is no evidence of pleural effusion or pneumothorax. The  heart is normal in size; the mediastinal contour is within normal limits. No acute osseous abnormalities are seen. IMPRESSION: Mild bibasilar opacities may reflect atelectasis or mild pneumonia. Electronically Signed   By: Roanna Raider M.D.   On: 08/24/2015 00:23   Ct Abdomen Pelvis W Contrast  08/24/2015  CLINICAL DATA:  Acute onset of low grade fever, chills, dysuria, rectal pain and mild shortness of breath. Hematuria, status post prostate biopsy yesterday. Initial encounter. EXAM: CT ABDOMEN AND PELVIS WITH CONTRAST TECHNIQUE: Multidetector CT imaging of the abdomen and pelvis was performed using the standard protocol following bolus administration of intravenous contrast. CONTRAST:  OMNIPAQUE IOHEXOL 300 MG/ML  SOLN COMPARISON:  None. FINDINGS: Mild bibasilar atelectasis is noted.  A tiny hiatal hernia is noted. The liver and spleen are unremarkable in appearance. The gallbladder is within normal limits. The pancreas and adrenal glands are unremarkable. A 2.7 cm cyst is noted at the lower pole of the left kidney. There is no evidence of hydronephrosis. No renal or ureteral stones are seen. Mild nonspecific left-sided perinephric stranding is seen. No free fluid is identified. The small bowel is unremarkable in appearance. The stomach is within normal limits. No acute vascular abnormalities are seen. Mild scattered calcification is noted along the abdominal aorta and its branches. The appendix is normal in caliber, without evidence of appendicitis. The colon is unremarkable in appearance. The bladder is mildly distended and grossly unremarkable. The prostate is enlarged, measuring up to 6.0 cm in AP dimension, with diffuse heterogeneity, likely reflecting recent biopsy. A small left inguinal hernia is seen, containing only fat. No inguinal lymphadenopathy is seen. No acute osseous abnormalities are identified. IMPRESSION: 1. No acute abnormality seen within the abdomen or pelvis. 2. Enlarged  prostate, measuring up to 6.0 cm in AP dimension, with diffuse heterogeneity, likely reflecting recent biopsy. 3. Tiny hiatal hernia noted. 4. Small left inguinal hernia, containing only fat. 5. Mild bibasilar atelectasis noted. 6. Small left renal cyst seen. 7. Mild scattered calcification along the abdominal aorta and its branches. Electronically Signed   By: Roanna Raider M.D.   On: 08/24/2015 00:43    Scheduled Meds: . heparin  5,000 Units Subcutaneous 3 times per day  . piperacillin-tazobactam (ZOSYN)  IV  3.375 g Intravenous Q8H  . sodium chloride  3 mL Intravenous Q12H   Continuous Infusions: . sodium chloride 125 mL/hr at 08/24/15 0331       Time spent: 35 minutes    [facility] A  [facility] Pager [phone] If 7PM-7AM, please contact night-coverage at [email or web address], password [facility] 08/24/2015, 10:40 AM  LOS: 0 days

## 2015-08-24 NOTE — Progress Notes (Signed)
ANTIBIOTIC CONSULT NOTE-follow up  Pharmacy Consult for Zosyn Indication: post-op prostatitis, UTI  No Known Allergies  Patient Measurements: Height: 5' (152.4 cm) Weight: 140 lb 11.2 oz (63.821 kg) IBW/kg (Calculated) : 50   Vital Signs: Temp: 98.4 F (36.9 C) (12/25 0608) Temp Source: Oral (12/25 96040608) BP: 108/52 mmHg (12/25 0608) Pulse Rate: 82 (12/25 0608)  Labs:  Recent Labs  08/23/15 2335  WBC 16.7*  HGB 15.0  PLT 223  CREATININE 0.89    Estimated Creatinine Clearance: 57.2 mL/min (by C-G formula based on Cr of 0.89).  No results for input(s): VANCOTROUGH, VANCOPEAK, VANCORANDOM, GENTTROUGH, GENTPEAK, GENTRANDOM, TOBRATROUGH, TOBRAPEAK, TOBRARND, AMIKACINPEAK, AMIKACINTROU, AMIKACIN in the last 72 hours.   Microbiology: Recent Results (from the past 720 hour(s))  Culture, blood (routine x 2)     Status: None (Preliminary result)   Collection Time: 08/24/15 12:24 AM  Result Value Ref Range Status   Specimen Description BLOOD RIGHT ANTECUBITAL  Final   Special Requests   Final    BOTTLES DRAWN AEROBIC AND ANAEROBIC AEB 3CC ANA 2CC   Culture NO GROWTH < 12 HOURS  Final   Report Status PENDING  Incomplete  Culture, blood (routine x 2)     Status: None (Preliminary result)   Collection Time: 08/24/15 12:32 AM  Result Value Ref Range Status   Specimen Description BLOOD RIGHT ARM  Final   Special Requests BOTTLES DRAWN AEROBIC ONLY 2CC  Final   Culture NO GROWTH < 12 HOURS  Final   Report Status PENDING  Incomplete    Medical History: Past Medical History  Diagnosis Date  . Benign prostatic hypertrophy   . Essential hypertension     Medications:  Scheduled:  . heparin  5,000 Units Subcutaneous 3 times per day  . piperacillin-tazobactam (ZOSYN)  IV  3.375 g Intravenous Once  . piperacillin-tazobactam (ZOSYN)  IV  3.375 g Intravenous Q8H  . sodium chloride  3 mL Intravenous Q12H    Assessment: 74 yo male [facility]  [facility] presented with fever, SOB, hematuria then dysuria, and rectal pain. Starting Zosyn, may add vanc if Ucx gram stain + GPC. Zosyn 3.375 GM IV x 1 dose given  Goal of Therapy:  eradication of infection  Plan:  Zosyn 3.375 GM IV every 8 hours, infuse each dose over 4 hours Monitor renal function Labs per protocol  Raquel JamesPittman, Norely Schlick ChathamBennett, RPH 08/24/2015,7:27 AM

## 2015-08-25 LAB — COMPREHENSIVE METABOLIC PANEL
ALBUMIN: 3.1 g/dL — AB (ref 3.5–5.0)
ALT: 31 U/L (ref 17–63)
AST: 23 U/L (ref 15–41)
Alkaline Phosphatase: 77 U/L (ref 38–126)
Anion gap: 5 (ref 5–15)
BUN: 9 mg/dL (ref 6–20)
CHLORIDE: 105 mmol/L (ref 101–111)
CO2: 24 mmol/L (ref 22–32)
Calcium: 8 mg/dL — ABNORMAL LOW (ref 8.9–10.3)
Creatinine, Ser: 0.77 mg/dL (ref 0.61–1.24)
GFR calc Af Amer: >60 mL/min (ref >60)
GFR calc non Af Amer: >60 mL/min (ref >60)
GLUCOSE: 141 mg/dL — AB (ref 65–99)
POTASSIUM: 3.6 mmol/L (ref 3.5–5.1)
SODIUM: 134 mmol/L — AB (ref 135–145)
Total Bilirubin: 0.6 mg/dL (ref 0.3–1.2)
Total Protein: 6.1 g/dL — ABNORMAL LOW (ref 6.5–8.1)

## 2015-08-25 LAB — CBC
HEMATOCRIT: 39.5 % (ref 39.0–52.0)
Hemoglobin: 13.1 g/dL (ref 13.0–17.0)
MCH: 29 pg (ref 26.0–34.0)
MCHC: 33.2 g/dL (ref 30.0–36.0)
MCV: 87.4 fL (ref 78.0–100.0)
Platelets: 197 10*3/uL (ref 150–400)
RBC: 4.52 MIL/uL (ref 4.22–5.81)
RDW: 14.1 % (ref 11.5–15.5)
WBC: 15.4 10*3/uL — ABNORMAL HIGH (ref 4.0–10.5)

## 2015-08-25 MED ORDER — HYDROCODONE-ACETAMINOPHEN 5-325 MG PO TABS
1.0000 | ORAL_TABLET | Freq: Four times a day (QID) | ORAL | Status: DC | PRN
  Administered 2015-08-25 – 2015-08-27 (×5): 2 via ORAL
  Administered 2015-08-28: 1 via ORAL
  Administered 2015-08-28: 2 via ORAL
  Filled 2015-08-25 (×8): qty 2

## 2015-08-25 MED ORDER — POTASSIUM CHLORIDE CRYS ER 20 MEQ PO TBCR
40.0000 meq | EXTENDED_RELEASE_TABLET | Freq: Once | ORAL | Status: AC
  Administered 2015-08-25: 40 meq via ORAL
  Filled 2015-08-25: qty 2

## 2015-08-25 NOTE — Progress Notes (Signed)
[facility] PROGRESS NOTE   Charles Cantu OXB:[id number] DOB: 1940-12-06 DOA: 08/23/2015 PCP: No PCP Per Patient  HPI/Subjective: Patient seen with 2 sons and 1 daughter at bedside, they helped to translate between [place] and [place]. Patient complains about moderate to severe pain with urination.  Assessment/Plan: Active Problems:   Febrile urinary tract infection   Prostatitis, acute   Sepsis ([place])    Acute prostatitis/UTI Patient has acute prostatitis probably bacterial post prostate biopsy. Started on Zosyn. History complaining about severe pain on urination. Continue to follow closely, if he developed fever will add gram-positive coverage.  Sepsis Patient met sepsis criteria with WBC of 16.7, heart rate of 103 and presence of infection. Patient aggressively hydrated with IV fluids. Continue current antibiotics.  Elevated PSA Patient follows with Dr. Marnee Guarneri in [facility]. Prostate biopsy performed for elevated PSA to rule out cancer.   Code Status: Full Code Family Communication: Plan discussed with the patient. Disposition Plan: Remains inpatient Diet: Diet Heart Room service appropriate?: Yes; Fluid consistency:: Thin  Consultants:  None  Procedures:  None  Antibiotics:  None   Objective: Filed Vitals:   08/24/15 2001 08/25/15 0457  BP: 130/58 139/65  Pulse: 90 92  Temp: 99.6 F (37.6 C) 99.7 F (37.6 C)  Resp: 16     Intake/Output Summary (Last 24 hours) at 08/25/15 1252 Last data filed at 08/24/15 1757  Gross per 24 hour  Intake 1543.75 ml  Output      0 ml  Net 1543.75 ml   Filed Weights   08/23/15 2300 08/24/15 0317  Weight: 63.05 kg (139 lb) 63.821 kg (140 lb 11.2 oz)    Exam: General: Alert and awake, oriented x3, not in any acute distress. HEENT: anicteric sclera, pupils reactive to light and accommodation, EOMI CVS: S1-S2 clear, no murmur rubs or gallops Chest: clear to auscultation bilaterally, no  wheezing, rales or rhonchi Abdomen: soft nontender, nondistended, normal bowel sounds, no organomegaly Extremities: no cyanosis, clubbing or edema noted bilaterally Neuro: Cranial nerves II-XII intact, no focal neurological deficits  Data Reviewed: Basic Metabolic Panel:  Recent Labs Lab 08/23/15 2335 08/25/15 0617  NA 133* 134*  K 3.8 3.6  CL 98* 105  CO2 25 24  GLUCOSE 173* 141*  BUN 16 9  CREATININE 0.89 0.77  CALCIUM 9.1 8.0*   Liver Function Tests:  Recent Labs Lab 08/23/15 2335 08/25/15 0617  AST 34 23  ALT 29 31  ALKPHOS 81 77  BILITOT 0.8 0.6  PROT 7.7 6.1*  ALBUMIN 4.4 3.1*   No results for input(s): LIPASE, AMYLASE in the last 168 hours. No results for input(s): AMMONIA in the last 168 hours. CBC:  Recent Labs Lab 08/23/15 2335 08/25/15 0617  WBC 16.7* 15.4*  NEUTROABS 14.8*  --   HGB 15.0 13.1  HCT 44.1 39.5  MCV 87.7 87.4  PLT 223 197   Cardiac Enzymes:  Recent Labs Lab 08/23/15 2335  TROPONINI <0.03   BNP (last 3 results) No results for input(s): BNP in the last 8760 hours.  ProBNP (last 3 results) No results for input(s): PROBNP in the last 8760 hours.  CBG: No results for input(s): GLUCAP in the last 168 hours.  Micro Recent Results (from the past 240 hour(s))  Urine culture     Status: None (Preliminary result)   Collection Time: 08/23/15 11:30 PM  Result Value Ref Range Status   Specimen Description URINE, CLEAN CATCH  Final   Special Requests Normal  Final  Culture   Final    TOO YOUNG TO READ Performed at [facility]    Report Status PENDING  Incomplete  Culture, blood (routine x 2)     Status: None (Preliminary result)   Collection Time: 08/24/15 12:24 AM  Result Value Ref Range Status   Specimen Description BLOOD RIGHT ANTECUBITAL  Final   Special Requests   Final    BOTTLES DRAWN AEROBIC AND ANAEROBIC AEB 3CC ANA [place]   Culture NO GROWTH 1 DAY  Final   Report Status PENDING  Incomplete  Culture, blood  (routine x 2)     Status: None (Preliminary result)   Collection Time: 08/24/15 12:32 AM  Result Value Ref Range Status   Specimen Description BLOOD RIGHT ARM  Final   Special Requests BOTTLES DRAWN AEROBIC ONLY 2CC  Final   Culture NO GROWTH 1 DAY  Final   Report Status PENDING  Incomplete     Studies: Dg Chest 2 View  08/24/2015  CLINICAL DATA:  Acute onset of right-sided chest pain and mild shortness of breath. Initial encounter. EXAM: CHEST  2 VIEW COMPARISON:  None. FINDINGS: The lungs are well-aerated. Mild bibasilar opacities may reflect atelectasis or mild pneumonia. There is no evidence of pleural effusion or pneumothorax. The heart is normal in size; the mediastinal contour is within normal limits. No acute osseous abnormalities are seen. IMPRESSION: Mild bibasilar opacities may reflect atelectasis or mild pneumonia. Electronically Signed   By: Garald Balding M.D.   On: 08/24/2015 00:23   Ct Abdomen Pelvis W Contrast  08/24/2015  CLINICAL DATA:  Acute onset of low grade fever, chills, dysuria, rectal pain and mild shortness of breath. Hematuria, status post prostate biopsy yesterday. Initial encounter. EXAM: CT ABDOMEN AND PELVIS WITH CONTRAST TECHNIQUE: Multidetector CT imaging of the abdomen and pelvis was performed using the standard protocol following bolus administration of intravenous contrast. CONTRAST:  167m OMNIPAQUE IOHEXOL 300 MG/ML  SOLN COMPARISON:  None. FINDINGS: Mild bibasilar atelectasis is noted.  A tiny hiatal hernia is noted. The liver and spleen are unremarkable in appearance. The gallbladder is within normal limits. The pancreas and adrenal glands are unremarkable. A 2.7 cm cyst is noted at the lower pole of the left kidney. There is no evidence of hydronephrosis. No renal or ureteral stones are seen. Mild nonspecific left-sided perinephric stranding is seen. No free fluid is identified. The small bowel is unremarkable in appearance. The stomach is within normal  limits. No acute vascular abnormalities are seen. Mild scattered calcification is noted along the abdominal aorta and its branches. The appendix is normal in caliber, without evidence of appendicitis. The colon is unremarkable in appearance. The bladder is mildly distended and grossly unremarkable. The prostate is enlarged, measuring up to 6.0 cm in AP dimension, with diffuse heterogeneity, likely reflecting recent biopsy. A small left inguinal hernia is seen, containing only fat. No inguinal lymphadenopathy is seen. No acute osseous abnormalities are identified. IMPRESSION: 1. No acute abnormality seen within the abdomen or pelvis. 2. Enlarged prostate, measuring up to 6.0 cm in AP dimension, with diffuse heterogeneity, likely reflecting recent biopsy. 3. Tiny hiatal hernia noted. 4. Small left inguinal hernia, containing only fat. 5. Mild bibasilar atelectasis noted. 6. Small left renal cyst seen. 7. Mild scattered calcification along the abdominal aorta and its branches. Electronically Signed   By: JGarald BaldingM.D.   On: 08/24/2015 00:43    Scheduled Meds: . heparin  5,000 Units Subcutaneous 3 times per day  .  piperacillin-tazobactam (ZOSYN)  IV  3.375 g Intravenous Q8H  . sodium chloride  3 mL Intravenous Q12H   Continuous Infusions: . sodium chloride 125 mL/hr at 08/25/15 0457       Time spent: 35 minutes    [facility] A  [facility] Pager [phone] If 7PM-7AM, please contact night-coverage at [email or web address], password [facility] 08/25/2015, 12:52 PM  LOS: 1 day

## 2015-08-25 NOTE — Care Management Note (Signed)
Case Management Note  Patient Details  Name: Donavan FoilHoracio Rousseau MRN: [id number] Date of Birth: [birth year]  Subjective/Objective:                  Pt admitted from home with UTI with fever. Pt lives with his wife and son and will return home at discharge. Pt is independent with ADL's.  Action/Plan: Will continue to follow for discharge planning needs.  Expected Discharge Date:                  Expected Discharge Plan:  Home/Self Care  In-House Referral:  NA  Discharge planning Services  CM Consult  Post Acute Care Choice:  NA Choice offered to:  NA  DME Arranged:    DME Agency:     HH Arranged:    HH Agency:     Status of Service:  Completed, signed off  Medicare Important Message Given:    Date Medicare IM Given:    Medicare IM give by:    Date Additional Medicare IM Given:    Additional Medicare Important Message give by:     If discussed at Long Length of Stay Meetings, dates discussed:    Additional Comments:  Cheryl FlashBlackwell, Gerrod Maule Crowder, RN 08/25/2015, 3:04 PM

## 2015-08-26 LAB — BASIC METABOLIC PANEL
Anion gap: 5 (ref 5–15)
BUN: 8 mg/dL (ref 6–20)
CALCIUM: 8 mg/dL — AB (ref 8.9–10.3)
CHLORIDE: 104 mmol/L (ref 101–111)
CO2: 26 mmol/L (ref 22–32)
CREATININE: 0.77 mg/dL (ref 0.61–1.24)
GFR calc Af Amer: >60 mL/min (ref >60)
GLUCOSE: 130 mg/dL — AB (ref 65–99)
Potassium: 3.7 mmol/L (ref 3.5–5.1)
Sodium: 135 mmol/L (ref 135–145)

## 2015-08-26 LAB — CBC
HEMATOCRIT: 35.8 % — AB (ref 39.0–52.0)
Hemoglobin: 12.2 g/dL — ABNORMAL LOW (ref 13.0–17.0)
MCH: 29.2 pg (ref 26.0–34.0)
MCHC: 34.1 g/dL (ref 30.0–36.0)
MCV: 85.6 fL (ref 78.0–100.0)
Platelets: 211 10*3/uL (ref 150–400)
RBC: 4.18 MIL/uL — ABNORMAL LOW (ref 4.22–5.81)
RDW: 13.7 % (ref 11.5–15.5)
WBC: 9.1 10*3/uL (ref 4.0–10.5)

## 2015-08-26 NOTE — Progress Notes (Signed)
[facility] PROGRESS NOTE   Charles Cantu IDU:[id number] DOB: [birth year] DOA: 08/23/2015 PCP: No PCP Per Patient  HPI/Subjective: Patient seen with his daughter Dewitt Hoes  At bedside, she is translating from [place] to [place]. Had urinary retention last night and Foley catheter placed.  Assessment/Plan: Active Problems:   Febrile urinary tract infection   Prostatitis, acute   Sepsis ([place])    Acute prostatitis/UTI Patient has acute prostatitis probably bacterial post prostate biopsy. Started on Zosyn. History complaining about severe pain on urination. Continue to follow closely, if he developed fever will add gram-positive coverage.   Urinary retention This is likely secondary to the swelling and the inflammatory process of the prostate.  Foley catheter placed , he has history of TURP.  Sepsis Patient met sepsis criteria with WBC of 16.7, heart rate of 103 and presence of infection. Patient aggressively hydrated with IV fluids. Continue current antibiotics.  Elevated PSA Patient follows with Dr. Marnee Guarneri in [facility]. Prostate biopsy performed for elevated PSA to rule out cancer.   Code Status: Full Code Family Communication: Plan discussed with the patient. Disposition Plan: Remains inpatient Diet: Diet Heart Room service appropriate?: Yes; Fluid consistency:: Thin  Consultants:  None  Procedures:  None  Antibiotics:  None   Objective: Filed Vitals:   08/25/15 2004 08/26/15 0521  BP: 125/67 108/62  Pulse: 77 72  Temp: 98.7 F (37.1 C) 99.1 F (37.3 C)  Resp: 16 18    Intake/Output Summary (Last 24 hours) at 08/26/15 1121 Last data filed at 08/26/15 4784  Gross per 24 hour  Intake 1589.17 ml  Output   1750 ml  Net -160.83 ml   Filed Weights   08/23/15 2300 08/24/15 0317  Weight: 63.05 kg (139 lb) 63.821 kg (140 lb 11.2 oz)    Exam: General: Alert and awake, oriented x3, not in any acute distress. HEENT:  anicteric sclera, pupils reactive to light and accommodation, EOMI CVS: S1-S2 clear, no murmur rubs or gallops Chest: clear to auscultation bilaterally, no wheezing, rales or rhonchi Abdomen: soft nontender, nondistended, normal bowel sounds, no organomegaly Extremities: no cyanosis, clubbing or edema noted bilaterally Neuro: Cranial nerves II-XII intact, no focal neurological deficits  Data Reviewed: Basic Metabolic Panel:  Recent Labs Lab 08/23/15 2335 08/25/15 0617 08/26/15 0639  NA 133* 134* 135  K 3.8 3.6 3.7  CL 98* 105 104  CO2 '25 24 26  ' GLUCOSE 173* 141* 130*  BUN '16 9 8  ' CREATININE 0.89 0.77 0.77  CALCIUM 9.1 8.0* 8.0*   Liver Function Tests:  Recent Labs Lab 08/23/15 2335 08/25/15 0617  AST 34 23  ALT 29 31  ALKPHOS 81 77  BILITOT 0.8 0.6  PROT 7.7 6.1*  ALBUMIN 4.4 3.1*   No results for input(s): LIPASE, AMYLASE in the last 168 hours. No results for input(s): AMMONIA in the last 168 hours. CBC:  Recent Labs Lab 08/23/15 2335 08/25/15 0617 08/26/15 0639  WBC 16.7* 15.4* 9.1  NEUTROABS 14.8*  --   --   HGB 15.0 13.1 12.2*  HCT 44.1 39.5 35.8*  MCV 87.7 87.4 85.6  PLT 223 197 211   Cardiac Enzymes:  Recent Labs Lab 08/23/15 2335  TROPONINI <0.03   BNP (last 3 results) No results for input(s): BNP in the last 8760 hours.  ProBNP (last 3 results) No results for input(s): PROBNP in the last 8760 hours.  CBG: No results for input(s): GLUCAP in the last 168 hours.  Micro Recent Results (from the past  240 hour(s))  Urine culture     Status: None (Preliminary result)   Collection Time: 08/23/15 11:30 PM  Result Value Ref Range Status   Specimen Description URINE, CLEAN CATCH  Final   Special Requests Normal  Final   Culture   Final    TOO YOUNG TO READ Performed at [facility]    Report Status PENDING  Incomplete  Culture, blood (routine x 2)     Status: None (Preliminary result)   Collection Time: 08/24/15 12:24 AM  Result  Value Ref Range Status   Specimen Description BLOOD RIGHT ANTECUBITAL  Final   Special Requests   Final    BOTTLES DRAWN AEROBIC AND ANAEROBIC AEB 3CC ANA [place]   Culture NO GROWTH 2 DAYS  Final   Report Status PENDING  Incomplete  Culture, blood (routine x 2)     Status: None (Preliminary result)   Collection Time: 08/24/15 12:32 AM  Result Value Ref Range Status   Specimen Description BLOOD RIGHT ARM  Final   Special Requests BOTTLES DRAWN AEROBIC ONLY 2CC  Final   Culture NO GROWTH 2 DAYS  Final   Report Status PENDING  Incomplete     Studies: No results found.  Scheduled Meds: . heparin  5,000 Units Subcutaneous 3 times per day  . piperacillin-tazobactam (ZOSYN)  IV  3.375 g Intravenous Q8H  . sodium chloride  3 mL Intravenous Q12H   Continuous Infusions: . sodium chloride 75 mL/hr at 08/26/15 0439       Time spent: 35 minutes    [facility] A  [facility] Pager [phone] If 7PM-7AM, please contact night-coverage at [email or web address], password [facility] 08/26/2015, 11:21 AM  LOS: 2 days

## 2015-08-26 NOTE — Progress Notes (Signed)
Patient's pain has increased and is not controlled with pain medication. Paged the on call MD will follow any new orders received and continue to monitor the patient.

## 2015-08-27 DIAGNOSIS — N390 Urinary tract infection, site not specified: Secondary | ICD-10-CM

## 2015-08-27 DIAGNOSIS — N410 Acute prostatitis: Secondary | ICD-10-CM

## 2015-08-27 DIAGNOSIS — R338 Other retention of urine: Secondary | ICD-10-CM

## 2015-08-27 LAB — URINE CULTURE: Special Requests: NORMAL

## 2015-08-27 NOTE — Progress Notes (Signed)
[facility] PROGRESS NOTE  Charles Cantu ZOX:[id number] DOB: 10/07/1940 DOA: 08/23/2015 PCP: No PCP Per Patient  Brief narrative 74 year old Hispanic male with history of essential hypertension and BPH who underwent biopsy of his prostate on 12/23 at [facility] and was discharged on prophylactic ciprofloxacin. The next day he had some hematuria that self resolved. However on 12/25 he developed fever with dysuria, rectal pain and some shortness of breath. He had completed his course of Cipro on the day of admission. On presentation he was found to have sepsis with leukocytosis, low-grade fever, tachycardia and mild tachypnea. UA positive for UTI. Patient admitted to hospitalist service and placed on empiric antibiotic for mild sepsis with acute prostatitis .  Interpreted by son in law at bedside  Assessment/Plan: Secondary to acute prostatitis Sepsis  resolved and symptoms improving. Urine culture growing Escherichia coli. Continue  Zosyn. Narrow antibiotic based on sensitivity. Pain controlled with when necessary Vicodin.   gentle hydration.  Urinary retention on 12/26 Possibly secondary to acute prostatitis. Only placed this admission. He has a history of TURP.  Elevated PSA  Patient follows [facility]  Prostate biopsy performed for elevated PSA to rule out cancer.  Diet: Regular DVT prophylaxis   Code Status:full code Family Communication: Son in law at bedside Disposition Plan: home in 1-2 days once symptoms completely resolved and final sensitivity available.    Consultants: None  Procedures:  none  Antibiotics:  IV zosyn 12/25--  HPI/Subjective Seen  and examined. Complains of burning sensation in his urine and some pain radiating to the anus. Remains afebrile  Objective: Filed Vitals:   08/26/15 2216 08/27/15 0454  BP: 161/75 143/62  Pulse: 85 75  Temp: 99.2 F (37.3 C) 98.2 F (36.8 C)  Resp: 20 18     Intake/Output Summary (Last 24 hours) at 08/27/15 0914 Last data filed at 08/27/15 0650  Gross per 24 hour  Intake   1560 ml  Output   3150 ml  Net  -1590 ml   Filed Weights   08/23/15 2300 08/24/15 0317  Weight: 63.05 kg (139 lb) 63.821 kg (140 lb 11.2 oz)    Exam:   General: elderly  male nontender distress   HEENT: moist mucosa  Chest: clear b/l  Cardiovascular: NS1&S2, no murmurs  GI: soft, ND, NT, BS+,  Foley catheter with clear urine   Musculoskeletal: warm, no edema   Data Reviewed: Basic Metabolic Panel:  Recent Labs Lab 08/23/15 2335 08/25/15 0617 08/26/15 0639  NA 133* 134* 135  K 3.8 3.6 3.7  CL 98* 105 104  CO2 25 24 26   GLUCOSE 173* 141* 130*  BUN 16 9 8   CREATININE 0.89 0.77 0.77  CALCIUM 9.1 8.0* 8.0*   Liver Function Tests:  Recent Labs Lab 08/23/15 2335 08/25/15 0617  AST 34 23  ALT 29 31  ALKPHOS 81 77  BILITOT 0.8 0.6  PROT 7.7 6.1*  ALBUMIN 4.4 3.1*   No results for input(s): LIPASE, AMYLASE in the last 168 hours. No results for input(s): AMMONIA in the last 168 hours. CBC:  Recent Labs Lab 08/23/15 2335 08/25/15 0617 08/26/15 0639  WBC 16.7* 15.4* 9.1  NEUTROABS 14.8*  --   --   HGB 15.0 13.1 12.2*  HCT 44.1 39.5 35.8*  MCV 87.7 87.4 85.6  PLT 223 197 211   Cardiac Enzymes:  Recent Labs Lab 08/23/15 2335  TROPONINI <0.03   BNP (last 3 results) No results for input(s): BNP  in the last 8760 hours.  ProBNP (last 3 results) No results for input(s): PROBNP in the last 8760 hours.  CBG: No results for input(s): GLUCAP in the last 168 hours.  Recent Results (from the past 240 hour(s))  Urine culture     Status: None (Preliminary result)   Collection Time: 08/23/15 11:30 PM  Result Value Ref Range Status   Specimen Description URINE, CLEAN CATCH  Final   Special Requests Normal  Final   Culture   Final    >=100,000 COLONIES/mL ESCHERICHIA COLI Performed at [facility]    Report Status PENDING   Incomplete  Culture, blood (routine x 2)     Status: None (Preliminary result)   Collection Time: 08/24/15 12:24 AM  Result Value Ref Range Status   Specimen Description BLOOD RIGHT ANTECUBITAL  Final   Special Requests   Final    BOTTLES DRAWN AEROBIC AND ANAEROBIC AEB 3CC ANA 2CC   Culture NO GROWTH 2 DAYS  Final   Report Status PENDING  Incomplete  Culture, blood (routine x 2)     Status: None (Preliminary result)   Collection Time: 08/24/15 12:32 AM  Result Value Ref Range Status   Specimen Description BLOOD RIGHT ARM  Final   Special Requests BOTTLES DRAWN AEROBIC ONLY 2CC  Final   Culture NO GROWTH 2 DAYS  Final   Report Status PENDING  Incomplete     Studies: No results found.  Scheduled Meds: . heparin  5,000 Units Subcutaneous 3 times per day  . piperacillin-tazobactam (ZOSYN)  IV  3.375 g Intravenous Q8H  . sodium chloride  3 mL Intravenous Q12H   Continuous Infusions: . sodium chloride 75 mL/hr at 08/27/15 0455      Time spent: 25 minutes    Charles Cantu  [facility] Pager [phone] If 7PM-7AM, please contact night-coverage at [email or web address], password [facility] 08/27/2015, 9:14 AM  LOS: 3 days

## 2015-08-28 DIAGNOSIS — A498 Other bacterial infections of unspecified site: Secondary | ICD-10-CM | POA: Diagnosis present

## 2015-08-28 DIAGNOSIS — R338 Other retention of urine: Secondary | ICD-10-CM | POA: Diagnosis present

## 2015-08-28 DIAGNOSIS — Z1612 Extended spectrum beta lactamase (ESBL) resistance: Secondary | ICD-10-CM

## 2015-08-28 DIAGNOSIS — A4151 Sepsis due to Escherichia coli [E. coli]: Secondary | ICD-10-CM

## 2015-08-28 MED ORDER — SODIUM CHLORIDE 0.9 % IV SOLN: 1.0000 g | INTRAVENOUS | Status: DC

## 2015-08-28 MED ORDER — HYDROCODONE-ACETAMINOPHEN 5-325 MG PO TABS: 2.0000 | ORAL_TABLET | Freq: Four times a day (QID) | ORAL | Status: DC | PRN

## 2015-08-28 MED ORDER — PIPERACILLIN-TAZOBACTAM 3.375 G IVPB: 3.3750 g | Freq: Three times a day (TID) | INTRAVENOUS | Status: DC

## 2015-08-28 MED ORDER — SODIUM CHLORIDE 0.9 % IV SOLN: 1.0000 g | Freq: Three times a day (TID) | INTRAVENOUS | Status: AC

## 2015-08-28 MED ORDER — ERTAPENEM SODIUM 1 G IJ SOLR
1.0000 g | INTRAMUSCULAR | Status: DC
  Administered 2015-08-28: 1 g via INTRAVENOUS
  Filled 2015-08-28 (×2): qty 1

## 2015-08-28 MED ORDER — ERTAPENEM SODIUM 1 G IJ SOLR: 1.0000 g | INTRAMUSCULAR | Status: DC

## 2015-08-28 MED ORDER — SODIUM CHLORIDE 0.9 % IV SOLN
1.0000 g | Freq: Once | INTRAVENOUS | Status: AC
  Administered 2015-08-28: 1 g via INTRAVENOUS
  Filled 2015-08-28: qty 1

## 2015-08-28 NOTE — Progress Notes (Signed)
MD made aware of ESBL in patient's urine, patient placed on contact isolation.

## 2015-08-28 NOTE — Progress Notes (Signed)
Pt finished IV antibiotic. Tubing D/C'd. PICC in place. Foley in place. Both without complication. Discharge instructions given to patient and family member. Family member read and signed AVS papers. Prescriptions given. Pt belongings gathered and given to family. Pt escorted out by NT to front entrance via wheelchair.

## 2015-08-28 NOTE — Discharge Instructions (Addendum)
Foley Catheter Care, Adult A Foley catheter is a soft, flexible tube that is placed into the bladder to drain urine. A Foley catheter may be inserted if:  You leak urine or are not able to control when you urinate (urinary incontinence).  You are not able to urinate when you need to (urinary retention).  You had prostate surgery or surgery on the genitals.  You have certain medical conditions, such as multiple sclerosis, dementia, or a spinal cord injury. If you are going home with a Foley catheter in place, follow the instructions below. TAKING CARE OF THE CATHETER  Wash your hands with soap and water.  Using mild soap and warm water on a clean washcloth:  Clean the area on your body closest to the catheter insertion site using a circular motion, moving away from the catheter. Never wipe toward the catheter because this could sweep bacteria up into the urethra and cause infection.  Remove all traces of soap. Pat the area dry with a clean towel. For males, reposition the foreskin.  Attach the catheter to your leg so there is no tension on the catheter. Use adhesive tape or a leg strap. If you are using adhesive tape, remove any sticky residue left behind by the previous tape you used.  Keep the drainage bag below the level of the bladder, but keep it off the floor.  Check throughout the day to be sure the catheter is working and urine is draining freely. Make sure the tubing does not become kinked.  Do not pull on the catheter or try to remove it. Pulling could damage internal tissues. TAKING CARE OF THE DRAINAGE BAGS You will be given two drainage bags to take home. One is a large overnight drainage bag, and the other is a smaller leg bag that fits underneath clothing. You may wear the overnight bag at any time, but you should never wear the smaller leg bag at night. Follow the instructions below for how to empty, change, and clean your drainage bags. Emptying the Drainage Bag You  must empty your drainage bag when it is  - full or at least 2-3 times a day.  Wash your hands with soap and water.  Keep the drainage bag below your hips, below the level of your bladder. This stops urine from going back into the tubing and into your bladder.  Hold the dirty bag over the toilet or a clean container.  Open the pour spout at the bottom of the bag and empty the urine into the toilet or container. Do not let the pour spout touch the toilet, container, or any other surface. Doing so can place bacteria on the bag, which can cause an infection.  Clean the pour spout with a gauze pad or cotton ball that has rubbing alcohol on it.  Close the pour spout.  Attach the bag to your leg with adhesive tape or a leg strap.  Wash your hands well. Changing the Drainage Bag Change your drainage bag once a month or sooner if it starts to smell bad or look dirty. Below are steps to follow when changing the drainage bag.  Wash your hands with soap and water.  Pinch off the rubber catheter so that urine does not spill out.  Disconnect the catheter tube from the drainage tube at the connection valve. Do not let the tubes touch any surface.  Clean the end of the catheter tube with an alcohol wipe. Use a different alcohol wipe to clean  the end of the drainage tube.  Connect the catheter tube to the drainage tube of the clean drainage bag.  Attach the new bag to the leg with adhesive tape or a leg strap. Avoid attaching the new bag too tightly.  Wash your hands well. Cleaning the Drainage Bag 1. Wash your hands with soap and water. 2. Wash the bag in warm, soapy water. 3. Rinse the bag thoroughly with warm water. 4. Fill the bag with a solution of white vinegar and water (1 cup vinegar to 1 qt warm water [.2 L vinegar to 1 L warm water]). Close the bag and soak it for 30 minutes in the solution. 5. Rinse the bag with warm water. 6. Hang the bag to dry with the pour spout open and hanging  downward. 7. Store the clean bag (once it is dry) in a clean plastic bag. 8. Wash your hands well. PREVENTING INFECTION  Wash your hands before and after handling your catheter.  Take showers daily and wash the area where the catheter enters your body. Do not take baths. Replace wet leg straps with dry ones, if this applies.  Do not use powders, sprays, or lotions on the genital area. Only use creams, lotions, or ointments as directed by your caregiver.  For females, wipe from front to back after each bowel movement.  Drink enough fluids to keep your urine clear or pale yellow unless you have a fluid restriction.  Do not let the drainage bag or tubing touch or lie on the floor.  Wear cotton underwear to absorb moisture and to keep your skin drier. SEEK MEDICAL CARE IF:   Your urine is cloudy or smells unusually bad.  Your catheter becomes clogged.  You are not draining urine into the bag or your bladder feels full.  Your catheter starts to leak. SEEK IMMEDIATE MEDICAL CARE IF:   You have pain, swelling, redness, or pus where the catheter enters the body.  You have pain in the abdomen, legs, lower back, or bladder.  You have a fever.  You see blood fill the catheter, or your urine is pink or red.  You have nausea, vomiting, or chills.  Your catheter gets pulled out. MAKE SURE YOU:   Understand these instructions.  Will watch your condition.  Will get help right away if you are not doing well or get worse.   This information is not intended to replace advice given to you by your health care provider. Make sure you discuss any questions you have with your health care provider.   Document Released: 08/16/2005 Document Revised: 12/31/2013 Document Reviewed: 08/07/2012 Elsevier Interactive Patient Education 2016 [employer].   Prostatitis (Prostatitis) La prostatitis es el enrojecimiento, dolor e hinchazn (inflamacin) de la glndula prosttica. La glndula  prosttica tiene el tamao de [place] y se encuentra justo debajo de la vejiga. CUIDADOS EN EL HOGAR   Tome los [employer] indic el mdico.  Tome baos de agua caliente (baos de asiento) segn las indicaciones del mdico. SOLICITE AYUDA SI:  Los sntomas empeoran en lugar de Scientist, clinical (histocompatibility and immunogenetics).  Tiene fiebre. SOLICITE AYUDA DE INMEDIATO SI:   Tiene escalofros.  Tiene malestar estomacal (nuseas) o vmitos.  Se siente dbil, sufre mareos o se desvanece (se desmaya).  Nota que no puede Copywriter, advertising.  Tiene sangre o grumos de [place] (cogulos)en el pis (orina). ASEGRESE DE QUE:  Comprende estas instrucciones.  Controlar su afeccin.  Recibir ayuda de inmediato si no mejora o  si empeora.   Esta informacin no tiene Theme park managercomo fin reemplazar el consejo del mdico. Asegrese de hacerle al mdico cualquier pregunta que tenga.   Document Released: 02/15/2012 Document Revised: 09/06/2014 Elsevier Interactive Patient Education [employer]2016 Elsevier Inc.

## 2015-08-28 NOTE — Care Management Note (Signed)
Case Management Note  Patient Details  Name: Charles Cantu MRN: [id number] Date of Birth: Aug 28, 1941  Expected Discharge Date:    08/28/2015              Expected Discharge Plan:  Home w Home Health Services  In-House Referral:  NA  Discharge planning Services  CM Consult  Post Acute Care Choice:  Home Health Choice offered to:  Patient  DME Arranged:    DME Agency:     HH Arranged:  RN HH Agency:  [facility]  Status of Service:  Completed, signed off  Medicare Important Message Given:    Date Medicare IM Given:    Medicare IM give by:    Date Additional Medicare IM Given:    Additional Medicare Important Message give by:     If discussed at Long Length of Stay Meetings, dates discussed:    Additional Comments: [facility]. [facility]. Pt will receive first dose of IV abx in AM. Alroy BailiffLinda Lothian, of [facility], made aware of DC plan and will obtain pt info from chart. [facility], he will be the one to [facility]. No further CM needs.   Malcolm Metrohildress, Charles Lecrone Demske, RN 08/28/2015, 2:30 PM

## 2015-08-28 NOTE — Discharge Summary (Addendum)
Physician Discharge Summary  Charles FoilHoracio Cantu ZOX:[id number] DOB: [birth year] DOA: 08/23/2015  PCP: No PCP Per Patient  Admit date: 08/23/2015 Discharge date: 08/28/2015  Time spent: 35 minutes  Recommendations for Outpatient Follow-up:  1. Patient will follow-up with urologist Dr. Janet BerlinMajid Mrzazadeh in 2 weeks. He will complete 2 more weeks of IV antibiotic (IV meropenem 1 g every 8 hours) stop date will be 09/11/2015. 2. Patient will be discharged on Foley catheter. Please attempt voiding trial removal of Foley once antibiotic completed ordering outpatient follow-up.   Discharge Diagnoses:  Principal Problem:   Sepsis (HCC)  Active Problems:   Febrile urinary tract infection   Prostatitis, acute   Infection due to ESBL-producing Escherichia coli   Acute urinary retention   Discharge Condition: Fair  Diet recommendation: Regular  Filed Weights   08/23/15 2300 08/24/15 0317  Weight: 63.05 kg (139 lb) 63.821 kg (140 lb 11.2 oz)    History of present illness:  Please refer to admission H&P for details, in brief, 74 year old Hispanic male with history of essential hypertension and BPH who [facility] and was discharged on prophylactic ciprofloxacin. The next day he had some hematuria that self resolved. However on 12/25 he developed fever with dysuria, rectal pain and some shortness of breath. He had completed his course of Cipro on the day of admission. On presentation he was found to have sepsis with leukocytosis, low-grade fever, tachycardia and mild tachypnea. UA positive for UTI. Patient admitted to hospitalist service and placed on empiric antibiotic for mild sepsis with acute prostatitis .  Hospital Course:  Secondary to acute prostatitis/ESBL UTI Sepsis resolved and symptoms improving. Urine culture growing ESBL. Patient was on empiric Zosyn but  culture is resistant to zosyn. (Sensitive to imipenem and nitrofurantoin  only). -Patient hemodynamically stable, remains afebrile and leukocytosis normalized. He can be discharged home on IV antibiotics. -PICC line placed prior to discharge. Plan to be discharged on daily ertapenem but his insurance and home health would not cover it. So it was decided to discharge him on IV meropenem 1 g every 8 hours for a two-week course.  -His urologist office was contacted and agreed to follow him as outpatient. I Have prescribed some pain medications as well.   Urinary retention on 12/26 Possibly secondary to acute prostatitis. Foley placed this admission and will need voiding trial and removal once infection subsides. He will follow-up with his urologist.   Elevated PSA  Patient [facility].  Prostate biopsy performed for elevated PSA to rule out cancer. Results awaited.     Code Status:full code Family Communication: Son and daughter at bedside who interpreted for the patient. Disposition Plan: home with home health patient to follow-up with his urologist in 1-2 weeks.   Consultants: None  Procedures:  none  Antibiotics:  IV zosyn 12/25--12/28    Discharge Exam: Filed Vitals:   08/27/15 2139 08/28/15 0554  BP: 126/64 149/80  Pulse: 65 71  Temp: 98.3 F (36.8 C) 98.2 F (36.8 C)  Resp: 18 18     General: elderly male not in distress   HEENT: moist mucosa  Chest: clear b/l  Cardiovascular: NS1&S2, no murmurs  GI: soft, ND, NT, BS+, Foley catheter with clear urine   Musculoskeletal: warm, no edema  CNS: Alert and oriented  Discharge Instructions    Current Discharge Medication List    START taking these medications   Details  HYDROcodone-acetaminophen (NORCO/VICODIN)  5-325 MG tablet Take 2 tablets by mouth every 6 (six) hours as needed for moderate pain. Qty: 30 tablet, Refills: 0    meropenem 1 g in sodium chloride 0.9 % 100 mL Inject 1 g into the vein every 8 (eight) hours. Qty: 42  application, Refills: 0      STOP taking these medications     ciprofloxacin (CIPRO) 500 MG tablet        No Known Allergies Follow-up Information    Follow up with Timothy Lasso, MD.   Specialty:  Urology   Why:  MD office will call for f/u visit when biopsy results are back   Contact information:   [address] [place] [address] [phone]       Follow up with [facility].   Contact information:   [address] [place] [address] [phone]        The results of significant diagnostics from this hospitalization (including imaging, microbiology, ancillary and laboratory) are listed below for reference.    Significant Diagnostic Studies: Dg Chest 2 View  08/24/2015  CLINICAL DATA:  Acute onset of right-sided chest pain and mild shortness of breath. Initial encounter. EXAM: CHEST  2 VIEW COMPARISON:  None. FINDINGS: The lungs are well-aerated. Mild bibasilar opacities may reflect atelectasis or mild pneumonia. There is no evidence of pleural effusion or pneumothorax. The heart is normal in size; the mediastinal contour is within normal limits. No acute osseous abnormalities are seen. IMPRESSION: Mild bibasilar opacities may reflect atelectasis or mild pneumonia. Electronically Signed   By: Roanna Raider M.D.   On: 08/24/2015 00:23   Ct Abdomen Pelvis W Contrast  08/24/2015  CLINICAL DATA:  Acute onset of low grade fever, chills, dysuria, rectal pain and mild shortness of breath. Hematuria, status post prostate biopsy yesterday. Initial encounter. EXAM: CT ABDOMEN AND PELVIS WITH CONTRAST TECHNIQUE: Multidetector CT imaging of the abdomen and pelvis was performed using the standard protocol following bolus administration of intravenous contrast. CONTRAST:  OMNIPAQUE IOHEXOL 300 MG/ML  SOLN COMPARISON:  None. FINDINGS: Mild bibasilar atelectasis is noted.  A tiny hiatal hernia is noted. The liver and spleen are unremarkable in  appearance. The gallbladder is within normal limits. The pancreas and adrenal glands are unremarkable. A 2.7 cm cyst is noted at the lower pole of the left kidney. There is no evidence of hydronephrosis. No renal or ureteral stones are seen. Mild nonspecific left-sided perinephric stranding is seen. No free fluid is identified. The small bowel is unremarkable in appearance. The stomach is within normal limits. No acute vascular abnormalities are seen. Mild scattered calcification is noted along the abdominal aorta and its branches. The appendix is normal in caliber, without evidence of appendicitis. The colon is unremarkable in appearance. The bladder is mildly distended and grossly unremarkable. The prostate is enlarged, measuring up to 6.0 cm in AP dimension, with diffuse heterogeneity, likely reflecting recent biopsy. A small left inguinal hernia is seen, containing only fat. No inguinal lymphadenopathy is seen. No acute osseous abnormalities are identified. IMPRESSION: 1. No acute abnormality seen within the abdomen or pelvis. 2. Enlarged prostate, measuring up to 6.0 cm in AP dimension, with diffuse heterogeneity, likely reflecting recent biopsy. 3. Tiny hiatal hernia noted. 4. Small left inguinal hernia, containing only fat. 5. Mild bibasilar atelectasis noted. 6. Small left renal cyst seen. 7. Mild scattered calcification along the abdominal aorta and its branches. Electronically Signed   By: Beryle Beams.D.  On: 08/24/2015 00:43    Microbiology: Recent Results (from the past 240 hour(s))  Urine culture     Status: None   Collection Time: 08/23/15 11:30 PM  Result Value Ref Range Status   Specimen Description URINE, CLEAN CATCH  Final   Special Requests Normal  Final   Culture   Final    >=100,000 COLONIES/mL ESCHERICHIA COLI Confirmed Extended Spectrum Beta-Lactamase Producer (ESBL) Performed at [facility]    Report Status 08/27/2015 FINAL  Final   Organism ID, Bacteria  ESCHERICHIA COLI  Final      Susceptibility   Escherichia coli - MIC*    AMPICILLIN >=32 RESISTANT Resistant     CEFAZOLIN >=64 RESISTANT Resistant     CEFTRIAXONE >=64 RESISTANT Resistant     CIPROFLOXACIN >=4 RESISTANT Resistant     GENTAMICIN 2 SENSITIVE Sensitive     IMIPENEM <=0.25 SENSITIVE Sensitive     NITROFURANTOIN <=16 SENSITIVE Sensitive     TRIMETH/SULFA >=320 RESISTANT Resistant     AMPICILLIN/SULBACTAM >=32 RESISTANT Resistant     PIP/TAZO 16 SENSITIVE Sensitive     * >=100,000 COLONIES/mL ESCHERICHIA COLI  Culture, blood (routine x 2)     Status: None (Preliminary result)   Collection Time: 08/24/15 12:24 AM  Result Value Ref Range Status   Specimen Description BLOOD RIGHT ANTECUBITAL  Final   Special Requests   Final    BOTTLES DRAWN AEROBIC AND ANAEROBIC AEB 3CC ANA 2CC   Culture NO GROWTH 4 DAYS  Final   Report Status PENDING  Incomplete  Culture, blood (routine x 2)     Status: None (Preliminary result)   Collection Time: 08/24/15 12:32 AM  Result Value Ref Range Status   Specimen Description BLOOD RIGHT ARM  Final   Special Requests BOTTLES DRAWN AEROBIC ONLY 2CC  Final   Culture NO GROWTH 4 DAYS  Final   Report Status PENDING  Incomplete     Labs: Basic Metabolic Panel:  Recent Labs Lab 08/23/15 2335 08/25/15 0617 08/26/15 0639  NA 133* 134* 135  K 3.8 3.6 3.7  CL 98* 105 104  CO2 GLUCOSE 173* 141* 130*  BUN CREATININE 0.89 0.77 0.77  CALCIUM 9.1 8.0* 8.0*   Liver Function Tests:  Recent Labs Lab 08/23/15 2335 08/25/15 0617  AST 34 23  ALT 29 31  ALKPHOS 81 77  BILITOT 0.8 0.6  PROT 7.7 6.1*  ALBUMIN 4.4 3.1*   No results for input(s): LIPASE, AMYLASE in the last 168 hours. No results for input(s): AMMONIA in the last 168 hours. CBC:  Recent Labs Lab 08/23/15 2335 08/25/15 0617 08/26/15 0639  WBC 16.7* 15.4* 9.1  NEUTROABS 14.8*  --   --   HGB 15.0 13.1 12.2*  HCT 44.1 39.5 35.8*  MCV 87.7 87.4 85.6   PLT 223 197 211   Cardiac Enzymes:  Recent Labs Lab 08/23/15 2335  TROPONINI <0.03   BNP: BNP (last 3 results) No results for input(s): BNP in the last 8760 hours.  ProBNP (last 3 results) No results for input(s): PROBNP in the last 8760 hours.  CBG: No results for input(s): GLUCAP in the last 168 hours.     Signed:  Eddie North MD  FACP  [facility] 08/28/2015, 2:44 PM

## 2015-08-29 LAB — CULTURE, BLOOD (ROUTINE X 2)
CULTURE: NO GROWTH
Culture: NO GROWTH

## 2016-11-20 IMAGING — DX DG CHEST 2V
2 series · 2 of 2 positions shown · non-contrast
Comparison: None.

CLINICAL DATA: Acute onset of right-sided chest pain and mild
shortness of breath. Initial encounter.

EXAM:
CHEST  2 VIEW

[chest pa]
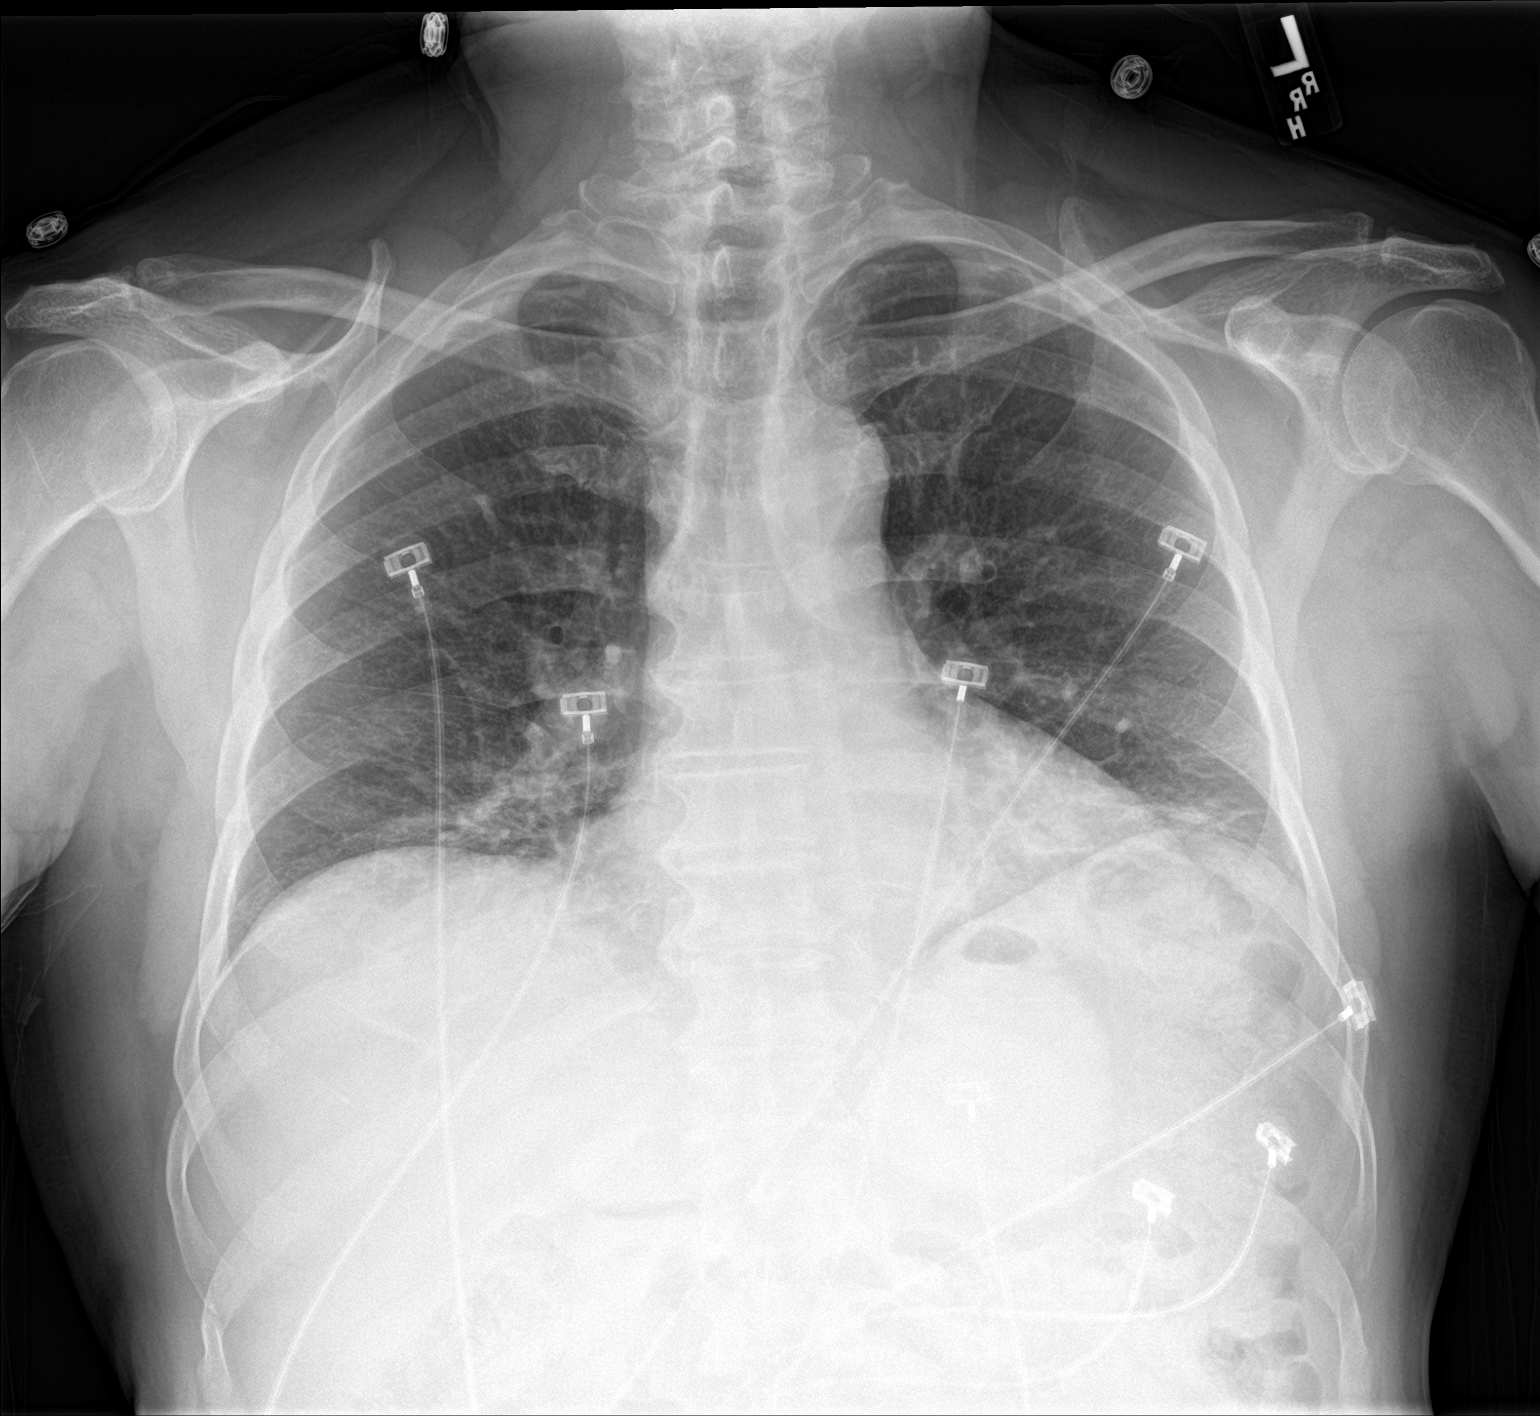

[chest lat]
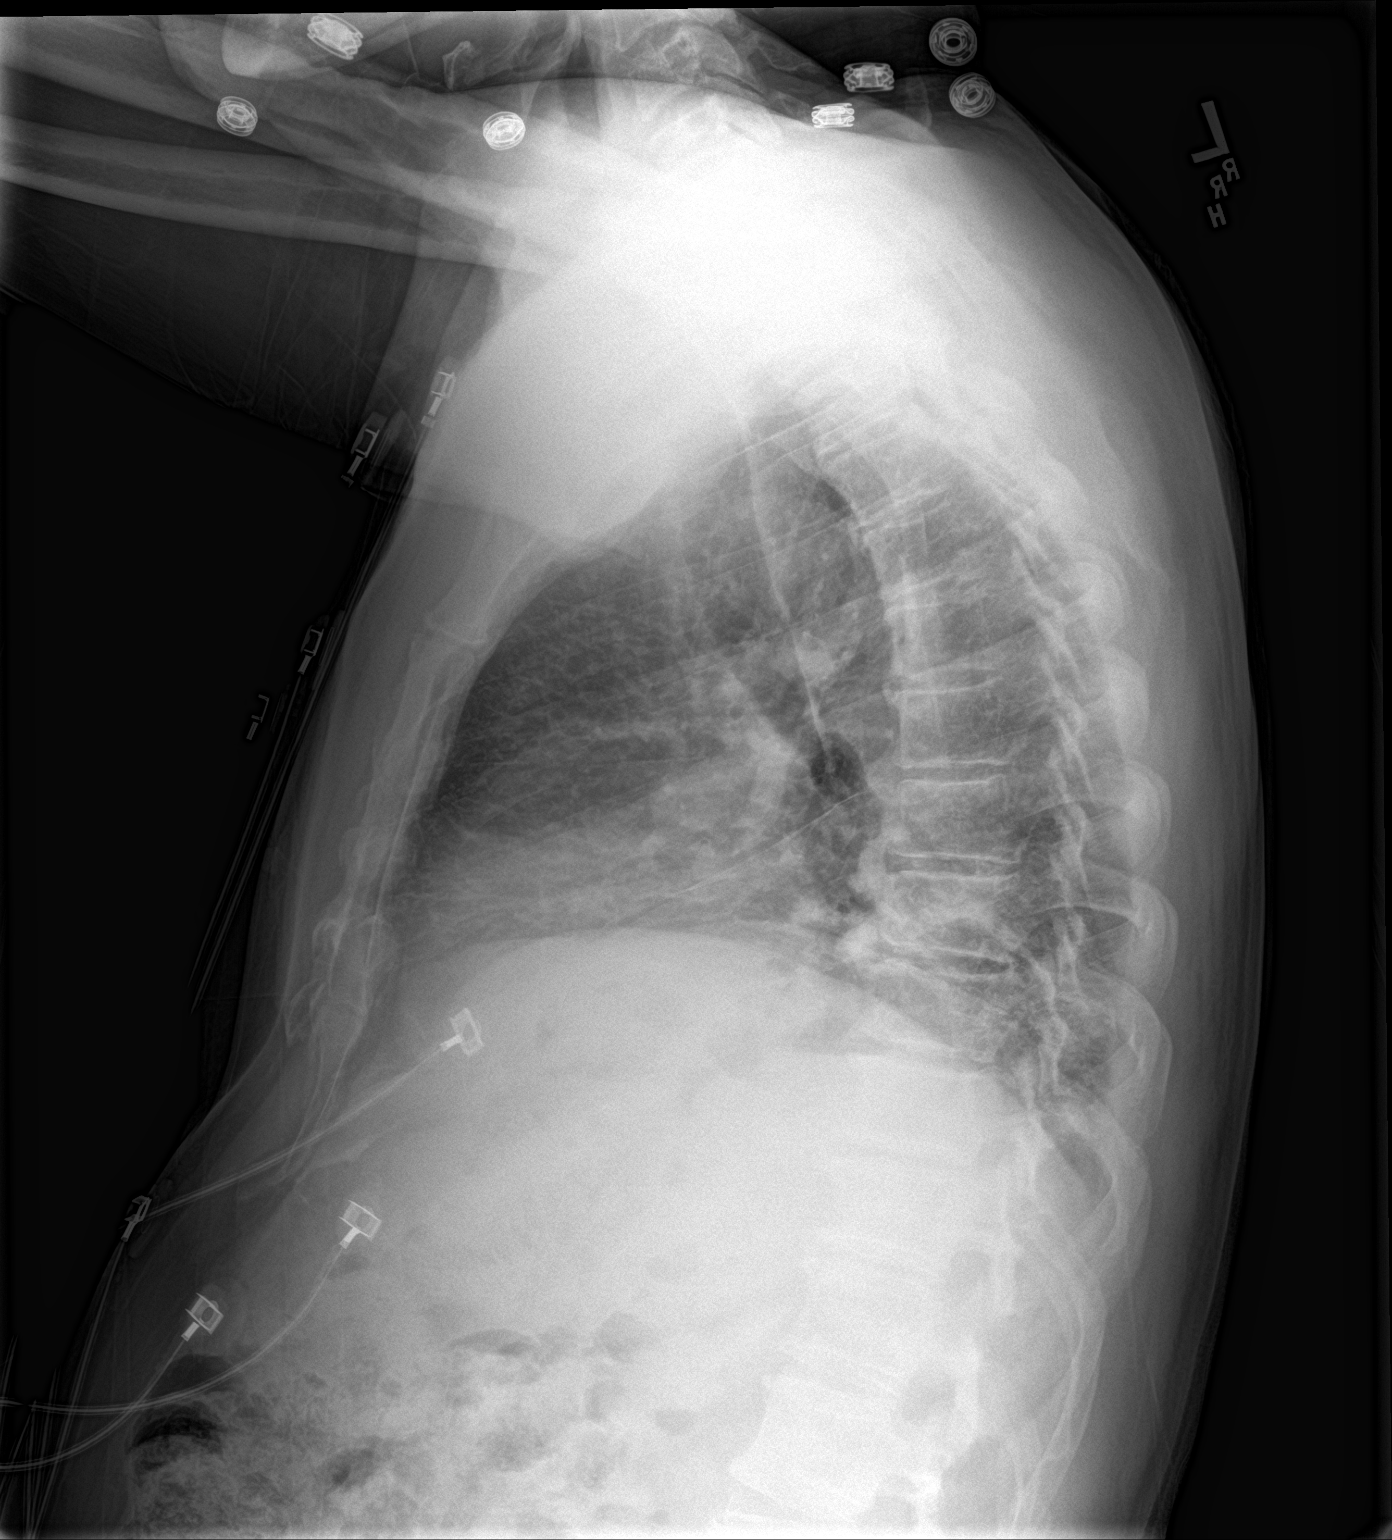

[2 of 2 positions shown; findings below may reference images not displayed]

FINDINGS: The lungs are well-aerated. Mild bibasilar opacities may reflect
atelectasis or mild pneumonia. There is no evidence of pleural
effusion or pneumothorax.

The heart is normal in size; the mediastinal contour is within
normal limits. No acute osseous abnormalities are seen.
IMPRESSION: Mild bibasilar opacities may reflect atelectasis or mild pneumonia.

## 2017-12-12 ENCOUNTER — Emergency Department (HOSPITAL_COMMUNITY): Payer: Self-pay

## 2017-12-12 ENCOUNTER — Encounter (HOSPITAL_COMMUNITY): Payer: Self-pay | Admitting: Emergency Medicine

## 2017-12-12 ENCOUNTER — Other Ambulatory Visit: Payer: Self-pay

## 2017-12-12 ENCOUNTER — Emergency Department (HOSPITAL_COMMUNITY)
Admission: EM | Admit: 2017-12-12 | Discharge: 2017-12-13 | Disposition: A | Discharge status: Home / Self Care | Payer: Self-pay | Attending: Emergency Medicine | Admitting: Emergency Medicine

## 2017-12-12 DIAGNOSIS — Y999 Unspecified external cause status: Secondary | ICD-10-CM | POA: Insufficient documentation

## 2017-12-12 DIAGNOSIS — Y929 Unspecified place or not applicable: Secondary | ICD-10-CM | POA: Insufficient documentation

## 2017-12-12 DIAGNOSIS — Z23 Encounter for immunization: Secondary | ICD-10-CM | POA: Insufficient documentation

## 2017-12-12 DIAGNOSIS — S62632B Displaced fracture of distal phalanx of right middle finger, initial encounter for open fracture: Secondary | ICD-10-CM | POA: Insufficient documentation

## 2017-12-12 DIAGNOSIS — Y9389 Activity, other specified: Secondary | ICD-10-CM | POA: Insufficient documentation

## 2017-12-12 DIAGNOSIS — I10 Essential (primary) hypertension: Secondary | ICD-10-CM | POA: Insufficient documentation

## 2017-12-12 DIAGNOSIS — S62630B Displaced fracture of distal phalanx of right index finger, initial encounter for open fracture: Secondary | ICD-10-CM | POA: Insufficient documentation

## 2017-12-12 DIAGNOSIS — W28XXXA Contact with powered lawn mower, initial encounter: Secondary | ICD-10-CM | POA: Insufficient documentation

## 2017-12-12 MED ORDER — BUPIVACAINE HCL (PF) 0.5 % IJ SOLN
10.0000 mL | Freq: Once | INTRAMUSCULAR | Status: AC
  Administered 2017-12-12: 10 mL
  Filled 2017-12-12: qty 10

## 2017-12-12 MED ORDER — LIDOCAINE HCL (PF) 1 % IJ SOLN
10.0000 mL | Freq: Once | INTRAMUSCULAR | Status: AC
  Administered 2017-12-12: 10 mL
  Filled 2017-12-12: qty 10

## 2017-12-12 MED ORDER — TETANUS-DIPHTH-ACELL PERTUSSIS 5-2.5-18.5 LF-MCG/0.5 IM SUSP
0.5000 mL | Freq: Once | INTRAMUSCULAR | Status: AC
  Administered 2017-12-12: 0.5 mL via INTRAMUSCULAR
  Filled 2017-12-12: qty 0.5

## 2017-12-12 MED ORDER — CEFAZOLIN SODIUM-DEXTROSE 1-4 GM/50ML-% IV SOLN
1.0000 g | Freq: Once | INTRAVENOUS | Status: AC
  Administered 2017-12-12: 1 g via INTRAVENOUS
  Filled 2017-12-12: qty 50

## 2017-12-12 NOTE — ED Triage Notes (Signed)
Daughter/translator stated, he was trying to fix the level on a lawn mower without cutting it off.  laceration to index and middle finger on right hand. Happened about hour half ago.

## 2017-12-12 NOTE — ED Provider Notes (Addendum)
[facility] EMERGENCY DEPARTMENT Provider Note   CSN: [id number] Arrival date & time: 12/12/17  1728     History   Chief Complaint Chief Complaint  Patient presents with  . Hand Injury  . Finger Injury    HPI Charles Cantu is a 77 y.o. male with a history of hypertension who presents the emergency department with right hand injury which occurred 3 hours prior to arrival.  Patient states that he reached underneath the lawnmower and did not realize it was still on resulting in injuries to his second and third digits.  States the area is uncomfortable.  No significant alleviating or aggravating factors.  Denies numbness or weakness.  Patient is unsure of last tetanus.  Patient is R hand dominant. Family at bedside assisted with translation.  HPI  Past Medical History:  Diagnosis Date  . Benign prostatic hypertrophy   . Essential hypertension     Patient Active Problem List   Diagnosis Date Noted  . Infection due to ESBL-producing Escherichia coli 08/28/2015  . Acute urinary retention 08/28/2015  . Febrile urinary tract infection 08/24/2015  . Prostatitis, acute 08/24/2015  . Sepsis (HCC) 08/24/2015  . Dizziness 10/16/2014  . Septic arthritis (HCC) 07/19/2011    Past Surgical History:  Procedure Laterality Date  . Cataract surgery Right 2012  . INGUINAL HERNIA REPAIR Bilateral 2012  . KNEE ASPIRATION Right 2012  . PROSTATE BIOPSY          Home Medications    Prior to Admission medications   Medication Sig Start Date End Date Taking? Authorizing Provider  HYDROcodone-acetaminophen (NORCO/VICODIN) 5-325 MG tablet Take 2 tablets by mouth every 6 (six) hours as needed for moderate pain. 08/28/15   Dhungel, Theda Belfast, MD    Family History Family History  Family history unknown: Yes    Social History Social History   Tobacco Use  . Smoking status: Never Smoker  . Smokeless tobacco: Never Used  Substance Use Topics  . Alcohol use: Yes   Alcohol/week: 0.0 oz    Comment: Occasional beer  . Drug use: No     Allergies   Patient has no known allergies.   Review of Systems Review of Systems  Skin: Positive for wound.  Neurological: Negative for weakness and numbness.     Physical Exam Updated Vital Signs BP (!) 161/92 (BP Location: Left Arm)   Pulse (!) 107   Temp 98.9 F (37.2 C) (Oral)   Resp 17   Ht 5' (1.524 m)   Wt 62.6 kg (138 lb)   SpO2 95%   BMI 26.95 kg/m   Physical Exam  Constitutional: He appears well-developed and well-nourished. No distress.  HENT:  Head: Normocephalic and atraumatic.  Eyes: Conjunctivae are normal. Right eye exhibits no discharge. Left eye exhibits no discharge.  Cardiovascular:  Pulses:      Radial pulses are 2+ on the right side, and 2+ on the left side.  Musculoskeletal:  Right hand: Extensive soft tissue injury to the distal aspect of the second and third digits of the right hand. There are complex lacerations involving the tip of the digits and pulp. Injury does not extend to the DIP.  There is minimal active bleeding upon my evaluation. Patient is able to flex and extend at all DIP, PIP, and MCP joints. Images below.    Neurological: He is alert.  Clear speech.  Upper extremities. Patient able to move all digits, sensation intact.  Psychiatric: He has a normal mood and  affect. His behavior is normal. Thought content normal.  Nursing note and vitals reviewed.    **         ED Treatments / Results  Labs (all labs ordered are listed, but only abnormal results are displayed) Labs Reviewed - No data to display  EKG None  Radiology Dg Hand Complete Left  Result Date: 12/12/2017 CLINICAL DATA:  injury, avulsion injuries to fingers Patient had trouble separating finger EXAM: LEFT HAND - COMPLETE 3+ VIEW COMPARISON:  None. FINDINGS: Severe comminution of the distal aspect of the third distal phalanx. Soft tissue injury associated. No dislocation. Mild  comminution of the distal tuft of the second digit. Soft tissue tissue injury involving the nail bed No clear foreign body. IMPRESSION: Comminuted fractures of the distal aspect of the second and third distal phalanges Electronically Signed   By: Genevive Bi M.D.   On: 12/12/2017 21:27    Procedures .Nerve Block Date/Time: 12/13/2017 1:26 AM Performed by: Cherly Anderson, PA-C Authorized by: Cherly Anderson, PA-C   Consent:    Consent obtained:  Verbal   Consent given by:  Patient   Risks discussed:  Allergic reaction, nerve damage, intravenous injection, pain and unsuccessful block   Alternatives discussed:  No treatment Indications:    Indications:  Pain relief Location:    Body area:  Upper extremity   Upper extremity nerve blocked: Digital, 2nd and 3rd digits.   Laterality:  Right Pre-procedure details:    Skin preparation:  2% chlorhexidine Procedure details (see MAR for exact dosages):    Block needle gauge:  25 G   Anesthetic injected:  Bupivacaine 0.5% w/o epi and lidocaine 1% w/o epi   Injection procedure:  Negative aspiration for blood and anatomic landmarks identified   Paresthesia:  Immediately resolved Post-procedure details:    Outcome:  Anesthesia achieved   Patient tolerance of procedure:  Tolerated well, no immediate complications   (including critical care time)  Medications Ordered in ED Medications  oxyCODONE-acetaminophen (PERCOCET/ROXICET) 5-325 MG per tablet 1 tablet (has no administration in time range)  lidocaine (PF) (XYLOCAINE) 1 % injection 10 mL (10 mLs Infiltration Given 12/12/17 2044)  Tdap (BOOSTRIX) injection 0.5 mL (0.5 mLs Intramuscular Given 12/12/17 2045)  bupivacaine (MARCAINE) 0.5 % injection 10 mL (10 mLs Infiltration Given 12/12/17 2045)  ceFAZolin (ANCEF) IVPB 1 g/50 mL premix (0 g Intravenous Stopped 12/12/17 2254)   Initial Impression / Assessment and Plan / ED Course  I have reviewed the triage vital signs and the  nursing notes.  Pertinent labs & imaging results that were available during my care of the patient were reviewed by me and considered in my medical decision making (see chart for details).   Patient presents with extensive soft tissue injury with complex lacerations to the 2nd and 3rd digits of the R hand, found to have comminuted fracture of distal phalanx of each of these digits.  Patient is able to flex and extend DIP/PIP/MCP of each of these digits. Tetanus has been updated.  Discussed with supervising physician Dr. Rosalia Hammers. Will plan for pressure irrigation, 1 gram of Ancef, and consult to hand surgery.   22:27: CONSULT: Discussed case with hand surgeon Dr. Janee Morn, instructs utilization of tourniquet for thorough scrubbing of the digit and subsequent loose closure with 4-0 chromic. His office will call the patient tomorrow morning to set up follow up for sometime Thursday for wound recheck.   Digital block per procedure note above. Wounds irrigated with 4L sterile water  utilizing pressure irrigation method. Wounds scrubbed with tourniquets in place as discussed above. With re-evaluation of the wounds with supervising physician Dr. Rosalia Hammersay, do not feel I am able to close the wound appropriately given absent tissue and complicated skin flaps/lacerations, Dr. Rosalia Hammersay is in agreement.. Will plan to re consult Dr. Janee Mornhompson.   23:19: CONSULT: Discussed with Dr. Janee Mornhompson who will come to the ER to evaluate the patient.   00:10: Patient has been seen by Dr. Janee Mornhompson- he has placed sutures and applied dressing, has provided prescriptions and instructions for discharge. His consultation is much appreciated.   Patient will be sent home with Augmentin and analgesics per Dr. Janee Mornhompson, his office will call the patient tomorrow morning to facilitate follow up.  Additionally patient tachycardic with elevated BP upon arrival, do not suspect HTN emergency at this time, patient made aware of need for recheck. His tachycardia  normalized and blood pressure remained elevated but improved.  I discussed results, treatment plan, need for follow-up with Dr. Janee Mornhompson, and return precautions with the patient and his family. Provided opportunity for questions, patient and his family confirmed understanding and are in agreement with plan.   Vitals:   12/12/17 1739 12/13/17 0027  BP: (!) 161/92 (!) 146/87  Pulse: (!) 107 87  Resp: 17 16  Temp: 98.9 F (37.2 C)   SpO2: 95% 98%     Final Clinical Impressions(s) / ED Diagnoses   Final diagnoses:  Displaced fracture of distal phalanx of right index finger, initial encounter for open fracture  Displaced fracture of distal phalanx of right middle finger, initial encounter for open fracture    ED Discharge Orders        Ordered    acetaminophen (TYLENOL) 325 MG tablet  Every 6 hours     12/13/17 0011    ibuprofen (ADVIL) 200 MG tablet  Every 6 hours     12/13/17 0011    oxyCODONE (ROXICODONE) 5 MG immediate release tablet  Every 6 hours PRN     12/13/17 0011    amoxicillin-clavulanate (AUGMENTIN) 875-125 MG tablet  2 times daily     12/13/17 0011       Jeremey Bascom, [place] LandingSamantha R, PA-C 12/13/17 0030     Nelli Swalley R, PA-C 12/13/17 0142    Margarita Grizzleay, Danielle, MD 12/13/17 1430

## 2017-12-12 NOTE — Progress Notes (Signed)
History, clinical images, & xrays reviewed.  Has fingertip injuries to 2 digits, largely with sparing of the nail complex, volar lacerations, and underlying tuft fractures.  Recommended ensuring tetanus is UTD, digital block with vigorous irrigation of the wounds, and loosely re-approximating the volar skin flaps with 4-0 Chromic absorbable interupted simple sutures.   Injuries are too distal for any reparable tendon or nerve injuries.  Further, it is impossible to know how the fractures will align until ST are re-approximated.  D/C with Augmentin, and my office will call patient tomorrow to perform wound check and new finger xrays on Wed/Thurs, and decide upon whether anything else for definitive management would be helpful.    Neil Crouch, MD Hand Surgery Office [phone]

## 2017-12-12 NOTE — Discharge Instructions (Addendum)
Leave bandages in place until re-evaluated by Dr. Janee Mornhompson at his office. Take your anti-biotics as prescribed to help prevent infection.  Additionally your blood pressure was elevated in the emergency department today.  It is important that you follow-up with your primary care doctor for recheck of your blood pressure and possible initiation of blood pressure medication.  Return to the ER at any time for any new or worsening symptoms or any other concerns that you may have.

## 2017-12-13 MED ORDER — ACETAMINOPHEN 325 MG PO TABS: 650.0000 mg | ORAL_TABLET | Freq: Four times a day (QID) | ORAL | Status: DC

## 2017-12-13 MED ORDER — IBUPROFEN 200 MG PO TABS: 600.0000 mg | ORAL_TABLET | Freq: Four times a day (QID) | ORAL | Status: DC

## 2017-12-13 MED ORDER — AMOXICILLIN-POT CLAVULANATE 875-125 MG PO TABS: 1.0000 | ORAL_TABLET | Freq: Two times a day (BID) | ORAL | 0 refills | Status: AC

## 2017-12-13 MED ORDER — OXYCODONE-ACETAMINOPHEN 5-325 MG PO TABS
1.0000 | ORAL_TABLET | Freq: Once | ORAL | Status: AC
  Administered 2017-12-13: 1 via ORAL
  Filled 2017-12-13: qty 1

## 2017-12-13 MED ORDER — OXYCODONE HCL 5 MG PO TABS: 5.0000 mg | ORAL_TABLET | Freq: Four times a day (QID) | ORAL | 0 refills | Status: DC | PRN

## 2017-12-13 NOTE — Consult Note (Addendum)
ORTHOPAEDIC CONSULTATION HISTORY & PHYSICAL REQUESTING PHYSICIAN: Margarita Grizzle, MD  Chief Complaint: Right index and long finger tip injuries  HPI: Charles Cantu is a 77 y.o. male who injured his right index and long fingertips with a lawnmower.  He presented to the emergency department, where x-rays were obtained, and I was consulted regarding his care.  I provided fairly detailed recommendations, but was subsequently contacted a second time by the PA involved, indicating that she and her attending physician had evaluated the patient and felt incapable of closing the wounds.  A digital block it already been performed with lidocaine and Marcaine.  He was indicated to me that he received IV antibiotics, and his tetanus was updated.  I indicated that these fingertip lacerations are indeed able to be loosely reapproximated to the extent that coverage exist in the emergency department, and that I would reapproximate the wounds in the ED myself.  Past Medical History:  Diagnosis Date  . Benign prostatic hypertrophy   . Essential hypertension    Past Surgical History:  Procedure Laterality Date  . Cataract surgery Right 2012  . INGUINAL HERNIA REPAIR Bilateral 2012  . KNEE ASPIRATION Right 2012  . PROSTATE BIOPSY     Social History   Socioeconomic History  . Marital status: Married    Spouse name: Not on file  . Number of children: Not on file  . Years of education: Not on file  . Highest education level: Not on file  Occupational History  . Not on file  Social Needs  . Financial resource strain: Not on file  . Food insecurity:    Worry: Not on file    Inability: Not on file  . Transportation needs:    Medical: Not on file    Non-medical: Not on file  Tobacco Use  . Smoking status: Never Smoker  . Smokeless tobacco: Never Used  Substance and Sexual Activity  . Alcohol use: Yes    Alcohol/week: 0.0 oz    Comment: Occasional beer  . Drug use: No  . Sexual activity: Not on  file  Lifestyle  . Physical activity:    Days per week: Not on file    Minutes per session: Not on file  . Stress: Not on file  Relationships  . Social connections:    Talks on phone: Not on file    Gets together: Not on file    Attends religious service: Not on file    Active member of club or organization: Not on file    Attends meetings of clubs or organizations: Not on file    Relationship status: Not on file  Other Topics Concern  . Not on file  Social History Narrative  . Not on file   Family History  Family history unknown: Yes   No Known Allergies Prior to Admission medications   Medication Sig Start Date End Date Taking? Authorizing Provider  HYDROcodone-acetaminophen (NORCO/VICODIN) 5-325 MG tablet Take 2 tablets by mouth every 6 (six) hours as needed for moderate pain. 08/28/15   Eddie North, MD   Dg Hand Complete Left  Result Date: 12/12/2017 CLINICAL DATA:  injury, avulsion injuries to fingers Patient had trouble separating finger EXAM: LEFT HAND - COMPLETE 3+ VIEW COMPARISON:  None. FINDINGS: Severe comminution of the distal aspect of the third distal phalanx. Soft tissue injury associated. No dislocation. Mild comminution of the distal tuft of the second digit. Soft tissue tissue injury involving the nail bed No clear foreign body. IMPRESSION: Comminuted  fractures of the distal aspect of the second and third distal phalanges Electronically Signed   By: Genevive BiStewart  Edmunds M.D.   On: 12/12/2017 21:27    Positive ROS: All other systems have been reviewed and were otherwise negative with the exception of those mentioned in the HPI and as above.  Physical Exam: Vitals: Refer to EMR. Constitutional:  WD, WN, NAD HEENT:  NCAT, EOMI Neuro/Psych:  Alert & oriented to person, place, and time; appropriate mood & affect Lymphatic: No generalized extremity edema or lymphadenopathy Extremities / MSK:  The extremities are normal with respect to appearance, ranges of motion,  joint stability, muscle strength/tone, sensation, & perfusion except as otherwise noted:  The right index and long fingers had complex lacerations to the pulp.  The lacerations were well distal to the DIP flexion crease, and did not involve any flexor or extensor tendon injury.  Bulk of the nail complex was intact for the index finger, and on the long finger the most distal portion of the nail complex was lacerated and missing.  There was a small abrasion on the adjacent ring finger.  The flaps appeared to be bloodied and perfused.  Assessment: Right index and long finger tip injuries, with largely conserved nail complex  Plan: I discussed these findings with the patient and his family.  I applied a tourniquet to the base of each digit, and then washed them copiously under running water in the sink, using a gauze to scrub it as well.  We then transition back to the bedside table, where the digits were prepped with Betadine and draped.  I debrided some small shards of skin and subcutaneous tissues that were tiny and likely not to be viable, complicating the closure.  Further, I removed the nail to the long finger, as it was partially detached from the underlying nail bed.  I then reapproximated the soft tissue flaps with 4-0 chromic interrupted sutures, 4 sutures in each fingertip.  This provided for gross reapproximation of the soft tissues, leaving some areas for drainage and healing by secondary intent.  I removed the tourniquets and dressed the digits with Xeroform, gauze, Kling, and co-ban.  He will be discharged with analgesics, oral antibiotics, and a plan to follow-up with me in a few days.  My office will call him tomorrow to make that appointment.  Procedures: 1.  Debridement of skin and subcutaneous tissues associated with open fracture of right index finger and right long finger 2.  Removal of nail plate from right long finger 3.  Intermediate repair of laceration of right index finger, 3  cm 4.  Intermediate repair of right long finger, 3 cm  Onalee Huaavid A. Janee Mornhompson, MD      Orthopaedic & Hand Surgery [facility] [address] [place], [place]  [id number] Office: [phone] Mobile: [phone]  12/13/2017, 12:01 AM

## 2018-03-07 NOTE — Progress Notes (Signed)
Cardiology Office Note  Date: 03/09/2018   ID: Charles FoilHoracio Cantu, DOB [birth year], MRN [id number]  [facility]  Consulting Cardiologist: Nona DellSamuel McDowell, MD   Chief Complaint  Patient presents with  . Dizziness    History of Present Illness: Charles Cantu is a 77 y.o. male referred for cardiology consultation by [facility]. Freida [facility]Allen [facility] of intermittent lightheadedness.  He is here today with his grandson and also a Spanish interpreter that assisted with our interview.  He reports a 8356-month history of intermittent lightheadedness/dizziness that occurs when he moves his head suddenly such as turning to the side quickly or turning over in bed.  This also occurs if he stands up quickly.  He has not had syncope nor has he had any chest pain or palpitations.  He is not on any prescription medications at this time.  Patient was seen back in 2016 with symptoms of vertigo, not found to be orthostatic at that time.  Over-the-counter meclizine was recommended with potential referral to ENT by PCP depending on symptom control.  I personally reviewed his ECG today which shows normal sinus rhythm.  Past Medical History:  Diagnosis Date  . Benign prostatic hypertrophy   . Essential hypertension   . Syncope, near   . Vertigo     Past Surgical History:  Procedure Laterality Date  . Cataract surgery Right 2012  . INGUINAL HERNIA REPAIR Bilateral 2012  . KNEE ASPIRATION Right 2012  . PROSTATE BIOPSY      No current outpatient medications on file.   No current facility-administered medications for this visit.    Allergies:  Patient has no known allergies.   Social History: The patient  reports that he has never smoked. He has never used smokeless tobacco. He reports that he drinks alcohol. He reports that he does not use drugs.   Family History: The patient's Family history is unknown by patient.   ROS:   Please see the history of present illness. Otherwise, complete review of systems is positive for hand lacerations in April.  All other systems are reviewed and negative.   Physical Exam: VS:  BP 134/74   Pulse 77   Ht 4\' 10"  (1.473 m)   Wt 138 lb 9.6 oz (62.9 kg)   SpO2 96%   BMI 28.97 kg/m , BMI Body mass index is 28.97 kg/m.  Wt Readings from Last 3 Encounters:  03/09/18 138 lb 9.6 oz (62.9 kg)  02/08/18 135 lb (61.2 kg)  12/12/17 138 lb (62.6 kg)    General: Elderly male, appears comfortable at rest. HEENT: Conjunctiva and lids normal, oropharynx clear. Neck: Supple, no elevated JVP or carotid bruits, no thyromegaly. Lungs: Clear to auscultation, nonlabored breathing at rest. Cardiac: Regular rate and rhythm, no S3, soft systolic murmur. Abdomen: Soft, nontender, bowel sounds present. Extremities: No pitting edema, distal pulses 2+. Skin: Warm and dry. Musculoskeletal: No kyphosis. Neuropsychiatric: Alert and oriented x3, affect grossly appropriate.  ECG: I personally reviewed the tracing from 08/23/2015 which showed sinus tachycardia with PACs and a Q waves inferiorly.  Recent Labwork:  December 2016: Potassium 3.7, BUN 8, creatinine 0.77, hemoglobin 12.2, platelets 211  Assessment and Plan:  Patient presents with symptoms very consistent with positional vertigo, likely inner ear etiology.  He has had some fullness in his ears in the last several weeks, no fevers or chills.  He does not report any palpitations or chest pain, has not had frank  unexplained syncope.  ECG shows normal sinus rhythm today.  Blood pressure is normal.  I have recommended that he try over-the-counter meclizine for symptomatic improvement, although if symptoms persist he should follow-up with his PCP in the next few weeks to consider referral to ENT for further evaluation.  No further cardiac testing is indicated at this time.  Current medicines were reviewed with the patient today.   Orders Placed  This Encounter  Procedures  . EKG 12-Lead    Disposition: Follow-up as needed.  Signed, Jonelle Sidle, MD, [facility] 03/09/2018 3:13 PM    [facility] [address] [place], [place], [place] [address] Phone: [phone]; Fax: [phone]

## 2018-03-08 ENCOUNTER — Encounter: Payer: Self-pay | Admitting: *Deleted

## 2018-03-09 ENCOUNTER — Encounter: Payer: Self-pay | Admitting: Cardiology

## 2018-03-09 ENCOUNTER — Ambulatory Visit (INDEPENDENT_AMBULATORY_CARE_PROVIDER_SITE_OTHER): Payer: Self-pay | Admitting: Cardiology

## 2018-03-09 VITALS — BP 134/74 | HR 77 | Ht <= 58 in | Wt 138.6 lb

## 2018-03-09 DIAGNOSIS — R42 Dizziness and giddiness: Secondary | ICD-10-CM

## 2018-03-09 DIAGNOSIS — H8113 Benign paroxysmal vertigo, bilateral: Secondary | ICD-10-CM

## 2018-03-09 NOTE — Patient Instructions (Signed)
Medication Instructions:  Your physician recommends that you continue on your current medications as directed. Please refer to the Current Medication list given to you today.  Labwork: NONE  Testing/Procedures: NONE  Follow-Up: Your physician recommends that you schedule a follow-up appointment AS NEEDED  Any Other Special Instructions Will Be Listed Below (If Applicable).  If you need a refill on your cardiac medications before your next appointment, please call your pharmacy. 

## 2019-03-12 IMAGING — DX DG HAND COMPLETE 3+V*L*
3 series · 3 of 3 positions shown · non-contrast
Comparison: None.

CLINICAL DATA: injury, avulsion injuries to fingers Patient had
trouble separating finger

EXAM:
LEFT HAND - COMPLETE 3+ VIEW

[hand ap]
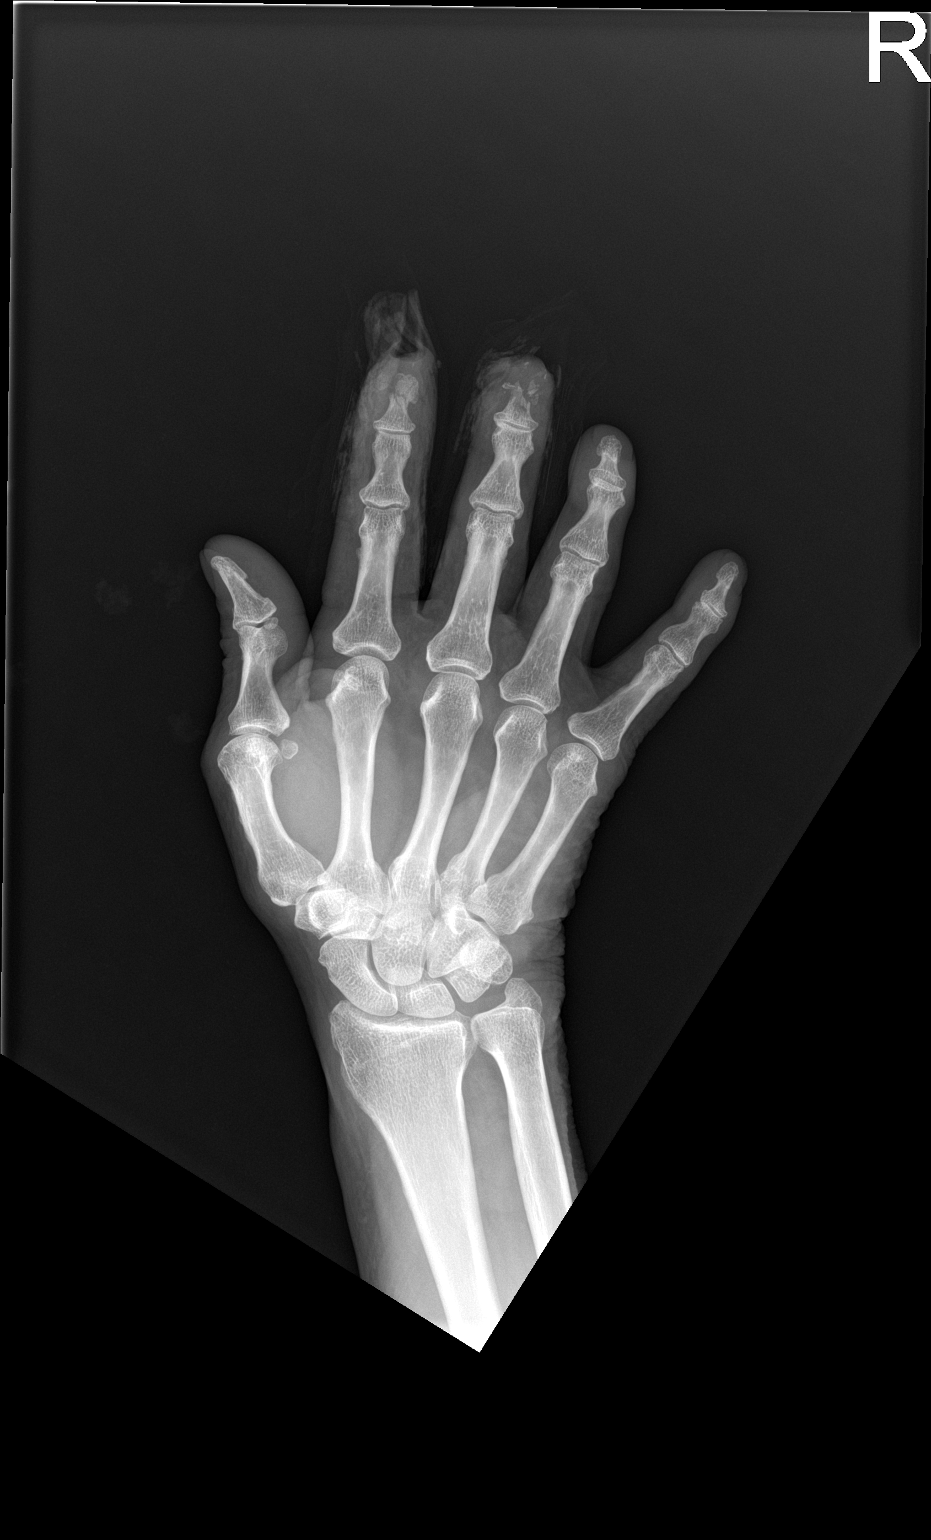

[hand obl]
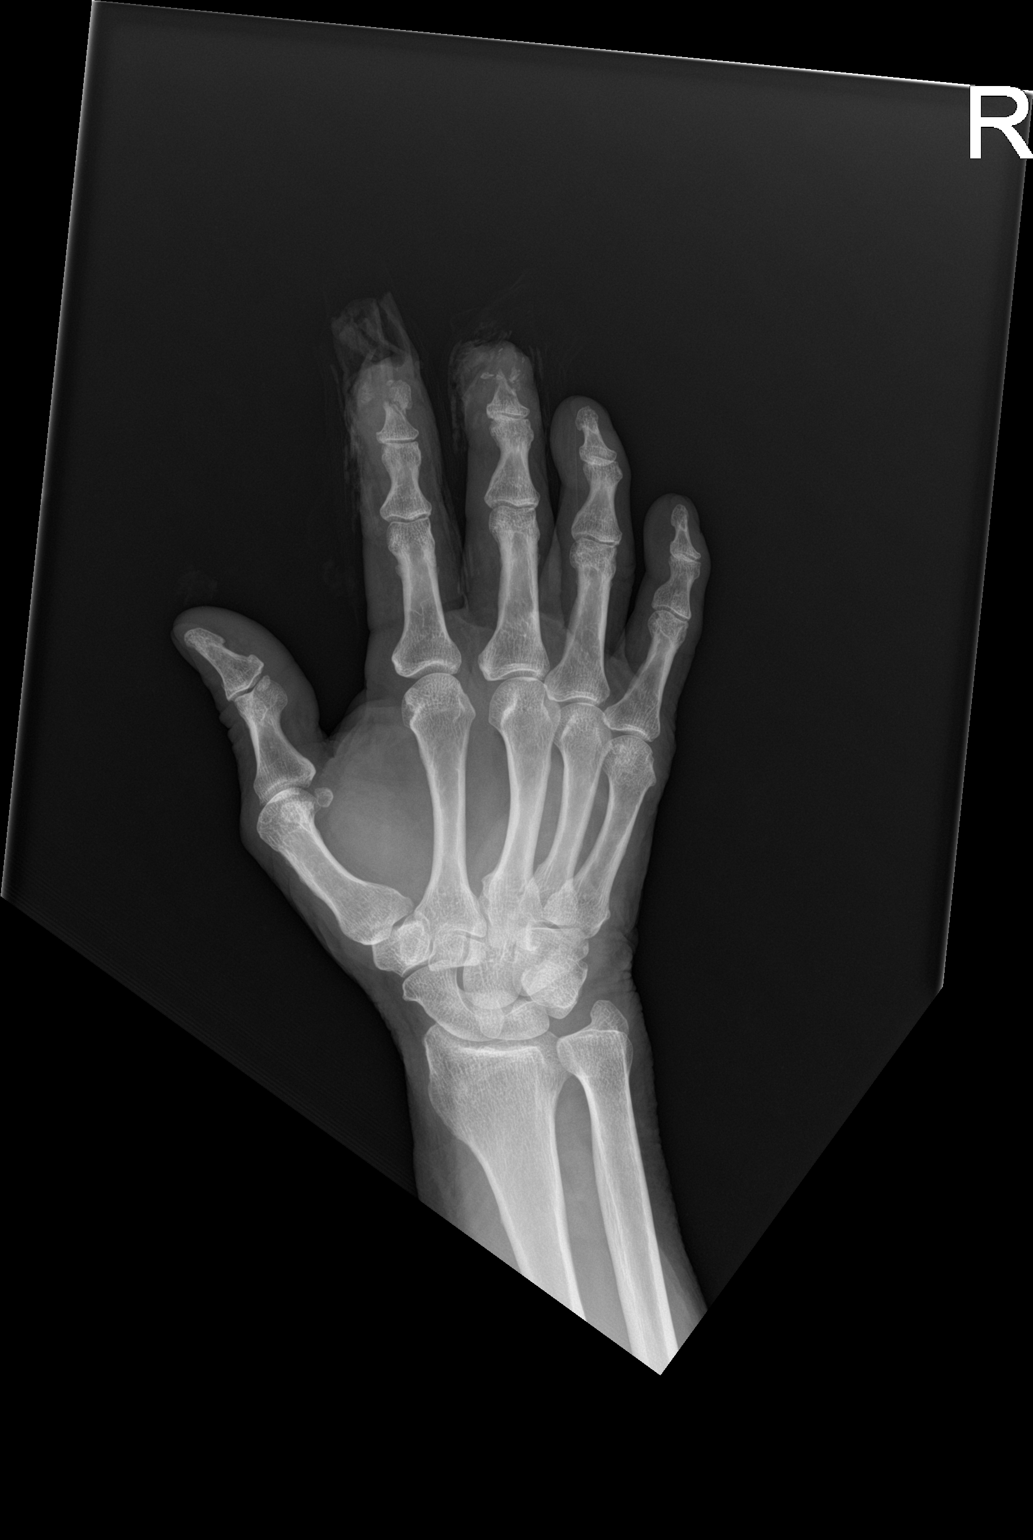

[hand lat]
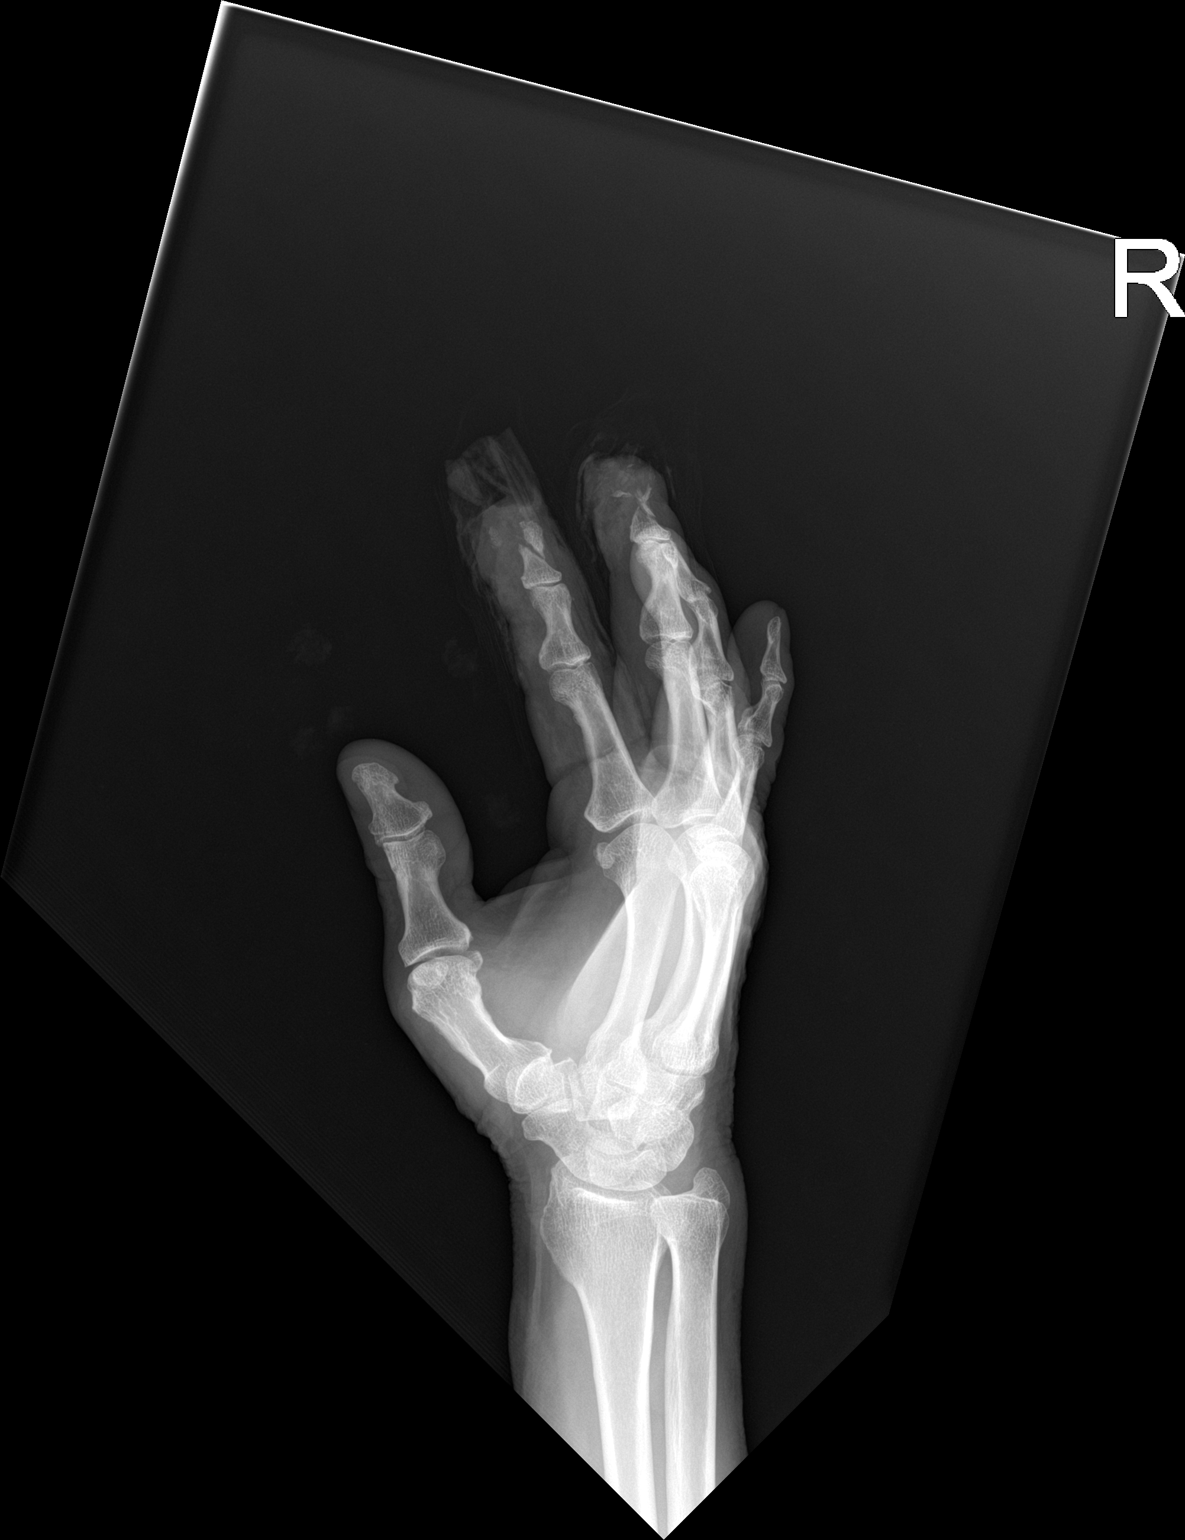

[3 of 3 positions shown; findings below may reference images not displayed]

FINDINGS: Severe comminution of the distal aspect of the third distal phalanx.
Soft tissue injury associated. No dislocation.

Mild comminution of the distal tuft of the second digit. Soft tissue
tissue injury involving the nail bed

No clear foreign body.
IMPRESSION: Comminuted fractures of the distal aspect of the second and third
distal phalanges

## 2019-04-04 ENCOUNTER — Ambulatory Visit: Payer: Self-pay | Admitting: Urology

## 2019-04-18 ENCOUNTER — Ambulatory Visit (INDEPENDENT_AMBULATORY_CARE_PROVIDER_SITE_OTHER): Payer: Self-pay | Admitting: Urology

## 2019-04-18 DIAGNOSIS — R972 Elevated prostate specific antigen [PSA]: Secondary | ICD-10-CM

## 2019-06-25 ENCOUNTER — Ambulatory Visit (INDEPENDENT_AMBULATORY_CARE_PROVIDER_SITE_OTHER): Payer: Self-pay | Admitting: Otolaryngology

## 2019-06-25 ENCOUNTER — Other Ambulatory Visit: Payer: Self-pay

## 2019-06-25 DIAGNOSIS — H903 Sensorineural hearing loss, bilateral: Secondary | ICD-10-CM

## 2019-06-28 ENCOUNTER — Ambulatory Visit (INDEPENDENT_AMBULATORY_CARE_PROVIDER_SITE_OTHER): Payer: Self-pay | Admitting: Otolaryngology

## 2019-07-18 ENCOUNTER — Ambulatory Visit (INDEPENDENT_AMBULATORY_CARE_PROVIDER_SITE_OTHER): Payer: Self-pay | Admitting: Urology

## 2019-07-18 DIAGNOSIS — R350 Frequency of micturition: Secondary | ICD-10-CM

## 2019-07-18 DIAGNOSIS — R972 Elevated prostate specific antigen [PSA]: Secondary | ICD-10-CM

## 2019-08-29 ENCOUNTER — Ambulatory Visit: Payer: Self-pay | Admitting: Urology

## 2019-10-14 ENCOUNTER — Other Ambulatory Visit: Payer: Self-pay

## 2019-10-14 ENCOUNTER — Ambulatory Visit: Payer: Self-pay | Attending: Internal Medicine

## 2019-10-14 ENCOUNTER — Ambulatory Visit: Payer: Self-pay

## 2019-10-14 DIAGNOSIS — Z23 Encounter for immunization: Secondary | ICD-10-CM | POA: Insufficient documentation

## 2019-10-14 NOTE — Progress Notes (Signed)
   Covid-19 Vaccination Clinic  Name:  Charles Cantu    MRN: [id number] DOB: 01-Sep-1940  10/14/2019  Mr. Charles Cantu was observed post Covid-19 immunization for 15 minutes without incidence. He was provided with Vaccine Information Sheet and instruction to access the V-Safe system.   Mr. Charles Cantu was instructed to call 911 with any severe reactions post vaccine: Marland Kitchen Difficulty breathing  . Swelling of your face and throat  . A fast heartbeat  . A bad rash all over your body  . Dizziness and weakness    Immunizations Administered    Name Date Dose VIS Date Route   Moderna COVID-19 Vaccine 10/14/2019  3:12 PM 0.5 mL 07/31/2019 Intramuscular   Manufacturer: Moderna   Lot: [id number]   NDC: 13086-578-46

## 2019-11-11 ENCOUNTER — Ambulatory Visit: Payer: Self-pay | Attending: Internal Medicine

## 2019-11-11 DIAGNOSIS — Z23 Encounter for immunization: Secondary | ICD-10-CM

## 2019-11-11 NOTE — Progress Notes (Signed)
   Covid-19 Vaccination Clinic  Name:  Suheyb Raucci    MRN: [id number] DOB: [birth year]  11/11/2019  Mr. Kossman was observed post Covid-19 immunization for 15 minutes without incident. He was provided with Vaccine Information Sheet and instruction to access the V-Safe system.   Mr. Haughey was instructed to call 911 with any severe reactions post vaccine: Marland Kitchen Difficulty breathing  . Swelling of face and throat  . A fast heartbeat  . A bad rash all over body  . Dizziness and weakness   Immunizations Administered    Name Date Dose VIS Date Route   Moderna COVID-19 Vaccine 11/11/2019  1:22 PM 0.5 mL 07/31/2019 Intramuscular   Manufacturer: Moderna   Lot: [id number]   NDC: 33825-053-97

## 2020-01-16 ENCOUNTER — Other Ambulatory Visit: Payer: Self-pay

## 2020-01-16 ENCOUNTER — Encounter: Payer: Self-pay | Admitting: Urology

## 2020-01-16 ENCOUNTER — Other Ambulatory Visit: Payer: Self-pay | Admitting: Urology

## 2020-01-16 ENCOUNTER — Ambulatory Visit (INDEPENDENT_AMBULATORY_CARE_PROVIDER_SITE_OTHER): Payer: Self-pay | Admitting: Urology

## 2020-01-16 VITALS — BP 168/76 | HR 75 | Temp 97.9°F | Ht 61.0 in | Wt 140.0 lb

## 2020-01-16 DIAGNOSIS — R350 Frequency of micturition: Secondary | ICD-10-CM

## 2020-01-16 DIAGNOSIS — R972 Elevated prostate specific antigen [PSA]: Secondary | ICD-10-CM | POA: Insufficient documentation

## 2020-01-16 MED ORDER — ALFUZOSIN HCL ER 10 MG PO TB24: 10.0000 mg | ORAL_TABLET | Freq: Every day | ORAL | 3 refills | Status: DC

## 2020-01-16 MED ORDER — MIRABEGRON ER 25 MG PO TB24: 25.0000 mg | ORAL_TABLET | Freq: Every day | ORAL | 0 refills | Status: DC

## 2020-01-16 MED ORDER — FINASTERIDE 5 MG PO TABS: 5.0000 mg | ORAL_TABLET | Freq: Every day | ORAL | 3 refills | Status: DC

## 2020-01-16 NOTE — Progress Notes (Signed)
01/16/2020 3:04 PM   Charles Cantu 07/30/1941 [id number]  Referring provider: Health, [facility] [address] [place],  [place] [address]  Urinary frequency  HPI: Charles Cantu is a 78yo here for followup for elevated PSA and urinary frequency. He did not get a PSA. He is currently on finasteride and uroxatral. Last visit he was started on uroxatral which slightly improved his urinary frequency. He has urgency and rare urge incontinence. Nocturia 3-4x. NO dysuria or hematuria.    PMH: Past Medical History:  Diagnosis Date  . Benign prostatic hypertrophy   . Elevated PSA   . Essential hypertension   . Syncope, near   . Urinary frequency   . Vertigo     Surgical History: Past Surgical History:  Procedure Laterality Date  . Cataract surgery Right 2012  . INGUINAL HERNIA REPAIR Bilateral 2012  . KNEE ASPIRATION Right 2012  . PROSTATE BIOPSY      Home Medications:  Allergies as of 01/16/2020   Not on File     Medication List       Accurate as of Jan 16, 2020  3:04 PM. If you have any questions, ask your nurse or doctor.        alfuzosin 10 MG 24 hr tablet Commonly known as: UROXATRAL Take 10 mg by mouth daily with breakfast.   atorvastatin 80 MG tablet Commonly known as: LIPITOR Take 80 mg by mouth daily.   finasteride 5 MG tablet Commonly known as: PROSCAR Take 5 mg by mouth daily.   Vascepa 1 g capsule Generic drug: icosapent Ethyl Take 2 g by mouth 2 (two) times daily.       Allergies: Not on File  Family History: Family History  Family history unknown: Yes    Social History:  reports that he has never smoked. He has never used smokeless tobacco. He reports current alcohol use. He reports that he does not use drugs.  ROS: All other review of systems were reviewed and are negative except what is noted above in HPI  Physical Exam: BP (!) 168/76   Pulse 75   Temp 97.9 F (36.6 C)   Ht 5\' 1"  (1.549 m)   Wt 140 lb (63.5 kg)    BMI 26.45 kg/m   Constitutional:  Alert and oriented, No acute distress. HEENT: [place] AT, moist mucus membranes.  Trachea midline, no masses. Cardiovascular: No clubbing, cyanosis, or edema. Respiratory: Normal respiratory effort, no increased work of breathing. GI: Abdomen is soft, nontender, nondistended, no abdominal masses GU: No CVA tenderness.  Lymph: No cervical or inguinal lymphadenopathy. Skin: No rashes, bruises or suspicious lesions. Neurologic: Grossly intact, no focal deficits, moving all 4 extremities. Psychiatric: Normal mood and affect.  Laboratory Data: Lab Results  Component Value Date   WBC 9.1 08/26/2015   HGB 12.2 (L) 08/26/2015   HCT 35.8 (L) 08/26/2015   MCV 85.6 08/26/2015   PLT 211 08/26/2015    Lab Results  Component Value Date   CREATININE 0.77 08/26/2015    No results found for: PSA  No results found for: TESTOSTERONE  No results found for: HGBA1C  Urinalysis    Component Value Date/Time   COLORURINE YELLOW 08/23/2015 2330   APPEARANCEUR HAZY (A) 08/23/2015 2330   LABSPEC 1.025 08/23/2015 2330   PHURINE 6.0 08/23/2015 2330   GLUCOSEU 100 (A) 08/23/2015 2330   HGBUR LARGE (A) 08/23/2015 2330   BILIRUBINUR NEGATIVE 08/23/2015 2330   KETONESUR TRACE (A) 08/23/2015 2330   PROTEINUR  TRACE (A) 08/23/2015 2330   NITRITE NEGATIVE 08/23/2015 2330   LEUKOCYTESUR SMALL (A) 08/23/2015 2330    Lab Results  Component Value Date   BACTERIA MANY (A) 08/23/2015    Pertinent Imaging:  No results found for this or any previous visit. No results found for this or any previous visit. No results found for this or any previous visit. No results found for this or any previous visit. No results found for this or any previous visit. No results found for this or any previous visit. No results found for this or any previous visit. No results found for this or any previous visit.  Assessment & Plan:    1. Elevated PSA -PSA today, if stable RTC 6  months with PSA - PSA; Future  2. Urinary frequency -add mirabegron 25mg  -continue uroxatral 10mg  and finasteride   No follow-ups on file.  Nicolette Bang, MD  [facility] [place]

## 2020-01-16 NOTE — Progress Notes (Signed)
Urological Symptom Review  Patient is experiencing the following symptoms: Frequent urination Get up at night to urinate Weak stream Hard to postpone  Review of Systems  Gastrointestinal (upper)  : Negative for upper GI symptoms  Gastrointestinal (lower) : Negative for lower GI symptoms  Constitutional : Negative for symptoms  Skin: Skin rash/lesion  Eyes: Blurred vision Double vision  Ear/Nose/Throat : Negative for Ear/Nose/Throat symptoms  Hematologic/Lymphatic: Negative for Hematologic/Lymphatic symptoms  Cardiovascular : Negative for cardiovascular symptoms  Respiratory : Shortness of breath  Endocrine: Negative for endocrine symptoms  Musculoskeletal: Back and joint pain  Neurological: Dizziness  Psychologic: Negative for psychiatric symptoms

## 2020-01-16 NOTE — Patient Instructions (Signed)
Prueba del antgeno prosttico especfico Prostate-Specific Antigen Test Por qu me debo realizar esta prueba? La prueba del antgeno prosttico especfico (APE) es una prueba de deteccin del cncer de prstata. Con esta prueba, se pueden identificar signos tempranos del cncer de prstata, lo cual puede permitir brindar un tratamiento ms eficaz. Su mdico puede recomendarle que se realice la prueba del APE a partir de los 40aos de edad o antes o despus de esa edad, segn sus factores de riesgo del cncer de prstata. Tambin pueden realizarle la prueba del APE:  Para controlar el tratamiento del cncer de prstata.  Para saber si el cncer de prstata ha regresado despus del tratamiento.  Si tiene signos de otras enfermedades que pueden [employer] niveles del APE, por ejemplo: ? Cuando la prstata se agranda por otras causas que no sean cncer (hiperplasia prosttica benigna, HPB). Esta afeccin es muy frecuente en los hombres de [place]. ? Una infeccin prosttica. Qu se analiza? Esta prueba mide la cantidad de APE en la sangre. El APE es una protena que se produce en la prstata. La prstata produce naturalmente ms APE a medida que usted envejece, pero los niveles muy altos de APE pueden ser un signo de una afeccin mdica. Qu tipo de Benjamin Perez se toma?  Para esta prueba, se extrae Truddie Coco de [place]. Por lo general, para extraerla, se introduce una aguja en un vaso sanguneo o se pincha un dedo con una aguja pequea. La sangre para esta prueba debe extraerse antes de que se someta a un examen de prstata. Cmo debo prepararme para esta prueba? No eyacule 24horas antes de la prueba o segn le haya indicado el mdico. Informe al mdico acerca de lo siguiente:  Cualquier alergia que tenga.  Todos los [employer], incluidos vitaminas, hierbas, gotas oftlmicas, cremas y medicamentos de venta libre. Esto tambin incluye: ? Los medicamentos para ayudar con el  crecimiento del cabello, como la finasterida. ? Cualquier exposicin reciente a un medicamento llamado dietilestilbestrol.  Cualquier enfermedad de la sangre que tenga.  Cualquier procedimiento reciente al que se haya sometido, especialmente procedimientos que involucren la prstata o el recto.  Cualquier afeccin mdica que tenga.  Cualquier infeccin de las vas urinarias (IU) reciente que haya tenido. Cmo se informan los resultados? Los resultados de la prueba se informan como un valor que indica qu cantidad de APE tiene en la [place]. Este valor se proporciona como nanogramos de APE por mililitro de sangre (ng/ml). Su mdico comparar sus resultados con los rangos normales que se establecieron luego de Optometrist la prueba a un grupo grande de personas (rangos de referencia). Los rangos de referencia pueden variar entre laboratorios y hospitales. Los niveles de APE varan de Ardelia Mems persona a [place] y, por lo general, aumentan con la edad. Debido a esta variacin, no hay un solo valor de APE que se considere normal para todas [employer]. En lugar de esto, se utilizan los rangos de referencia de APE para describir si sus niveles de APE se consideran bajos o altos (elevados). Los rangos de referencia habituales son los siguientes:  Bajo: 0 a 2,5ng/ml.  Levemente a moderadamente elevado: 2,6 a 10,0ng/ml.  Moderadamente elevado: 10,0 a 19,9ng/ml.  Muy elevado: 20ng/ml o ms. Algunas veces, los resultados de la prueba pueden informar que existe una afeccin cuando en realidad no es as (resultado positivo falso). [place] significan los resultados? Un resultado de la prueba superior a 4ng/ml puede indicar que tiene mayor riesgo de Restaurant manager, fast food de  prstata. Sin embargo, la prueba de APE por s sola no es suficiente para Consulting civil engineer de prstata. Los [employer] de APE tambin pueden atribuirse al proceso de envejecimiento natural, una infeccin en la prstata o HPB. Un estudio de  deteccin de APE no puede indicarle si su APE es alto debido a un cncer o a otra causa. Una biopsia de prstata es la nica manera de diagnosticar el cncer de prstata. Un riesgo de [employer] prueba de APE es el diagnstico y tratamiento de un cncer de prstata que nunca habra causado ningn sntoma ni problema (sobrediagnstico y sobretratamiento). Hable con su mdico sobre lo que significan sus [place]. Preguntas para hacerle al mdico Consulte a su mdico o pregunte en el departamento donde se realiza la prueba acerca de lo siguiente:  Cundo estarn disponibles mis resultados?  Cmo obtendr mis resultados?  Cules son mis opciones de tratamiento?  Qu otras pruebas necesito?  Cules son los prximos pasos que debo seguir? Resumen  La prueba del antgeno prosttico especfico (APE) es una prueba de deteccin del cncer de prstata.  Su mdico puede recomendarle que se realice la prueba del APE a partir de los 40aos de edad o antes o despus de esa edad, segn sus factores de riesgo del cncer de prstata.  Un resultado de la prueba superior a 4ng/ml puede indicar que tiene mayor riesgo de sufrir cncer de prstata. Sin embargo, la causa de los niveles elevados puede ser una cantidad de enfermedades que no se relacionan con el cncer de prstata.  Hable con su mdico sobre lo que significan sus [place]. Esta informacin no tiene Theme park manager el consejo del mdico. Asegrese de hacerle al mdico cualquier pregunta que tenga. Document Revised: 11/26/2017 Document Reviewed: 07/26/2017 Elsevier Patient Education  2020 [employer].

## 2020-01-17 LAB — PSA: PSA: 3.8 ng/mL (ref <=4.0)

## 2020-01-22 NOTE — Progress Notes (Signed)
Letter mailed

## 2020-03-05 ENCOUNTER — Ambulatory Visit: Payer: Self-pay | Admitting: Urology

## 2020-04-09 ENCOUNTER — Other Ambulatory Visit: Payer: Self-pay

## 2020-04-09 ENCOUNTER — Encounter: Payer: Self-pay | Admitting: Urology

## 2020-04-09 ENCOUNTER — Ambulatory Visit (INDEPENDENT_AMBULATORY_CARE_PROVIDER_SITE_OTHER): Payer: Self-pay | Admitting: Urology

## 2020-04-09 VITALS — BP 165/77 | HR 69 | Temp 98.4°F

## 2020-04-09 DIAGNOSIS — R972 Elevated prostate specific antigen [PSA]: Secondary | ICD-10-CM

## 2020-04-09 DIAGNOSIS — R350 Frequency of micturition: Secondary | ICD-10-CM

## 2020-04-09 LAB — URINALYSIS, ROUTINE W REFLEX MICROSCOPIC
Bilirubin, UA: NEGATIVE
Glucose, UA: NEGATIVE
Ketones, UA: NEGATIVE
Leukocytes,UA: NEGATIVE
Nitrite, UA: NEGATIVE
Protein,UA: NEGATIVE
Specific Gravity, UA: 1.02 (ref 1.005–1.030)
Urobilinogen, Ur: 0.2 mg/dL (ref 0.2–1.0)
pH, UA: 6 (ref 5.0–7.5)

## 2020-04-09 LAB — MICROSCOPIC EXAMINATION
Bacteria, UA: NONE SEEN
Epithelial Cells (non renal): NONE SEEN /hpf (ref 0–10)
Renal Epithel, UA: NONE SEEN /hpf

## 2020-04-09 LAB — BLADDER SCAN AMB NON-IMAGING: Scan Result: 37.9

## 2020-04-09 MED ORDER — SILODOSIN 8 MG PO CAPS: 8.0000 mg | ORAL_CAPSULE | Freq: Every day | ORAL | 11 refills | Status: DC

## 2020-04-09 NOTE — Progress Notes (Signed)
sUrological Symptom Review  Patient is experiencing the following symptoms: Frequent urination Burning/pain with urination Get up at night to urinate Weak stream   Review of Systems  Gastrointestinal (upper)  : Negative for upper GI symptoms  Gastrointestinal (lower) : Negative for lower GI symptoms  Constitutional : Negative for symptoms  Skin: Itching  Eyes: Negative for eye symptoms  Ear/Nose/Throat : negative  Hematologic/Lymphatic: Negative for Hematologic/Lymphatic symptoms  Cardiovascular : Negative for cardiovascular symptoms  Respiratory : Negative for respiratory symptoms  Endocrine: Negative for endocrine symptoms  Musculoskeletal: Negative for musculoskeletal symptoms  Neurological: Negative for neurological symptoms  Psychologic: Negative for psychiatric symptoms

## 2020-04-09 NOTE — Patient Instructions (Signed)

## 2020-04-09 NOTE — Progress Notes (Signed)
04/09/2020 11:16 AM   Charles Cantu 05-31-1941 [id number]  Referring provider: Health, [facility] [address] [place],  [place] [address]  Urinary frequency  HPI: Charles Cantu is a 78yo here for followup for urinary frequency and elevated PSA. PSA 3.8. which is lower than it was 6 months ago. He was prescribed mirabegron which failed to improve his urinary frequency. He also have nocturia 7-8x. Stream is weak. He has urinary frequency and urgency after condsuming any liquids   PMH: Past Medical History:  Diagnosis Date  . Benign prostatic hypertrophy   . Elevated PSA   . Essential hypertension   . Syncope, near   . Urinary frequency   . Vertigo     Surgical History: Past Surgical History:  Procedure Laterality Date  . Cataract surgery Right 2012  . INGUINAL HERNIA REPAIR Bilateral 2012  . KNEE ASPIRATION Right 2012  . PROSTATE BIOPSY      Home Medications:  Allergies as of 04/09/2020   No Known Allergies     Medication List       Accurate as of April 09, 2020 11:16 AM. If you have any questions, ask your nurse or doctor.        alfuzosin 10 MG 24 hr tablet Commonly known as: UROXATRAL Take 1 tablet (10 mg total) by mouth daily with breakfast.   atorvastatin 80 MG tablet Commonly known as: LIPITOR Take 80 mg by mouth daily.   finasteride 5 MG tablet Commonly known as: PROSCAR Take 1 tablet (5 mg total) by mouth daily.   mirabegron ER 25 MG Tb24 tablet Commonly known as: MYRBETRIQ Take 1 tablet (25 mg total) by mouth daily.   Vascepa 1 g capsule Generic drug: icosapent Ethyl Take 2 g by mouth 2 (two) times daily.       Allergies: No Known Allergies  Family History: Family History  Family history unknown: Yes    Social History:  reports that he has never smoked. He has never used smokeless tobacco. He reports current alcohol use. He reports that he does not use drugs.  ROS: All other review of systems were reviewed and are  negative except what is noted above in HPI  Physical Exam: BP (!) 165/77   Pulse 69   Temp 98.4 F (36.9 C)   Constitutional:  Alert and oriented, No acute distress. HEENT: [place] AT, moist mucus membranes.  Trachea midline, no masses. Cardiovascular: No clubbing, cyanosis, or edema. Respiratory: Normal respiratory effort, no increased work of breathing. GI: Abdomen is soft, nontender, nondistended, no abdominal masses GU: No CVA tenderness.  Lymph: No cervical or inguinal lymphadenopathy. Skin: No rashes, bruises or suspicious lesions. Neurologic: Grossly intact, no focal deficits, moving all 4 extremities. Psychiatric: Normal mood and affect.  Laboratory Data: Lab Results  Component Value Date   WBC 9.1 08/26/2015   HGB 12.2 (L) 08/26/2015   HCT 35.8 (L) 08/26/2015   MCV 85.6 08/26/2015   PLT 211 08/26/2015    Lab Results  Component Value Date   CREATININE 0.77 08/26/2015    Lab Results  Component Value Date   PSA 3.8 01/16/2020    No results found for: TESTOSTERONE  No results found for: HGBA1C  Urinalysis    Component Value Date/Time   COLORURINE YELLOW 08/23/2015 2330   APPEARANCEUR HAZY (A) 08/23/2015 2330   LABSPEC 1.025 08/23/2015 2330   PHURINE 6.0 08/23/2015 2330   GLUCOSEU 100 (A) 08/23/2015 2330   HGBUR LARGE (A) 08/23/2015 2330  BILIRUBINUR NEGATIVE 08/23/2015 2330   KETONESUR TRACE (A) 08/23/2015 2330   PROTEINUR TRACE (A) 08/23/2015 2330   NITRITE NEGATIVE 08/23/2015 2330   LEUKOCYTESUR SMALL (A) 08/23/2015 2330    Lab Results  Component Value Date   BACTERIA MANY (A) 08/23/2015    Pertinent Imaging:  No results found for this or any previous visit.  No results found for this or any previous visit.  No results found for this or any previous visit.  No results found for this or any previous visit.  No results found for this or any previous visit.  No results found for this or any previous visit.  No results found for this or any  previous visit.  No results found for this or any previous visit.   Assessment & Plan:    1. Urinary frequency -Start rapaflo 8mg  qhs - BLADDER SCAN AMB NON-IMAGING - Urinalysis, Routine w reflex microscopic  2. Elevated PSA -RTC 6 months with PSA   No follow-ups on file.  , MD  [facility] [place]

## 2020-05-09 ENCOUNTER — Encounter: Payer: Self-pay | Admitting: Urology

## 2020-05-09 ENCOUNTER — Other Ambulatory Visit: Payer: Self-pay

## 2020-05-09 ENCOUNTER — Ambulatory Visit (INDEPENDENT_AMBULATORY_CARE_PROVIDER_SITE_OTHER): Payer: Self-pay | Admitting: Urology

## 2020-05-09 VITALS — Ht 61.0 in | Wt 140.0 lb

## 2020-05-09 DIAGNOSIS — R351 Nocturia: Secondary | ICD-10-CM

## 2020-05-09 DIAGNOSIS — N401 Enlarged prostate with lower urinary tract symptoms: Secondary | ICD-10-CM

## 2020-05-09 DIAGNOSIS — N138 Other obstructive and reflux uropathy: Secondary | ICD-10-CM | POA: Insufficient documentation

## 2020-05-09 DIAGNOSIS — R350 Frequency of micturition: Secondary | ICD-10-CM

## 2020-05-09 LAB — POCT URINALYSIS DIPSTICK
Bilirubin, UA: NEGATIVE
Glucose, UA: NEGATIVE
Ketones, UA: NEGATIVE
Leukocytes, UA: NEGATIVE
Nitrite, UA: NEGATIVE
Protein, UA: NEGATIVE
Spec Grav, UA: 1.02 (ref 1.010–1.025)
Urobilinogen, UA: 0.2 E.U./dL
pH, UA: 5 (ref 5.0–8.0)

## 2020-05-09 LAB — BLADDER SCAN AMB NON-IMAGING: Scan Result: 53

## 2020-05-09 MED ORDER — GEMTESA 75 MG PO TABS: 1.0000 | ORAL_TABLET | Freq: Every day | ORAL | 0 refills | Status: DC

## 2020-05-09 NOTE — Progress Notes (Signed)
05/09/2020 10:48 AM   Charles Cantu March 14, 1941 [id number]  Referring provider: Health, [facility] [address] [place],  [place] [address]  Urinary frequency  HPI: Charles Cantu is a 78yo here for followup for BPH with urinary frequency. Last visit he was started on rapaflo 8mg . He noted improved stream and nocturia. He continues to have urinary frequency every 30-60 minutes. He has urgency and urge incontinence which occurs daily. Occasional dysuria. He has glaucoma   PMH: Past Medical History:  Diagnosis Date  . Benign prostatic hypertrophy   . Elevated PSA   . Essential hypertension   . Syncope, near   . Urinary frequency   . Vertigo     Surgical History: Past Surgical History:  Procedure Laterality Date  . Cataract surgery Right 2012  . INGUINAL HERNIA REPAIR Bilateral 2012  . KNEE ASPIRATION Right 2012  . PROSTATE BIOPSY      Home Medications:  Allergies as of 05/09/2020   No Known Allergies     Medication List       Accurate as of May 09, 2020 10:48 AM. If you have any questions, ask your nurse or doctor.        alfuzosin 10 MG 24 hr tablet Commonly known as: UROXATRAL Take 1 tablet (10 mg total) by mouth daily with breakfast.   atorvastatin 80 MG tablet Commonly known as: LIPITOR Take 80 mg by mouth daily.   fenofibrate 145 MG tablet Commonly known as: TRICOR Take 145 mg by mouth daily.   finasteride 5 MG tablet Commonly known as: PROSCAR Take 1 tablet (5 mg total) by mouth daily.   mirabegron ER 25 MG Tb24 tablet Commonly known as: MYRBETRIQ Take 1 tablet (25 mg total) by mouth daily.   silodosin 8 MG Caps capsule Commonly known as: RAPAFLO Take 1 capsule (8 mg total) by mouth daily with breakfast.   Vascepa 1 g capsule Generic drug: icosapent Ethyl Take 2 g by mouth 2 (two) times daily.       Allergies: No Known Allergies  Family History: Family History  Family history unknown: Yes    Social History:   reports that he has never smoked. He has never used smokeless tobacco. He reports current alcohol use. He reports that he does not use drugs.  ROS: All other review of systems were reviewed and are negative except what is noted above in HPI  Physical Exam: Ht 5\' 1"  (1.549 m)   Wt 140 lb (63.5 kg)   BMI 26.45 kg/m   Constitutional:  Alert and oriented, No acute distress. HEENT: [name] AT, moist mucus membranes.  Trachea midline, no masses. Cardiovascular: No clubbing, cyanosis, or edema. Respiratory: Normal respiratory effort, no increased work of breathing. GI: Abdomen is soft, nontender, nondistended, no abdominal masses GU: No CVA tenderness.  Lymph: No cervical or inguinal lymphadenopathy. Skin: No rashes, bruises or suspicious lesions. Neurologic: Grossly intact, no focal deficits, moving all 4 extremities. Psychiatric: Normal mood and affect.  Laboratory Data: Lab Results  Component Value Date   WBC 9.1 08/26/2015   HGB 12.2 (L) 08/26/2015   HCT 35.8 (L) 08/26/2015   MCV 85.6 08/26/2015   PLT 211 08/26/2015    Lab Results  Component Value Date   CREATININE 0.77 08/26/2015    Lab Results  Component Value Date   PSA 3.8 01/16/2020    No results found for: TESTOSTERONE  No results found for: HGBA1C  Urinalysis    Component Value Date/Time   COLORURINE  YELLOW 08/23/2015 2330   APPEARANCEUR Clear 04/09/2020 1113   LABSPEC 1.025 08/23/2015 2330   PHURINE 6.0 08/23/2015 2330   GLUCOSEU Negative 04/09/2020 1113   HGBUR LARGE (A) 08/23/2015 2330   BILIRUBINUR negative 05/09/2020 1005   BILIRUBINUR Negative 04/09/2020 1113   KETONESUR TRACE (A) 08/23/2015 2330   PROTEINUR Negative 05/09/2020 1005   PROTEINUR Negative 04/09/2020 1113   PROTEINUR TRACE (A) 08/23/2015 2330   UROBILINOGEN 0.2 05/09/2020 1005   NITRITE negative 05/09/2020 1005   NITRITE Negative 04/09/2020 1113   NITRITE NEGATIVE 08/23/2015 2330   LEUKOCYTESUR Negative 05/09/2020 1005   LEUKOCYTESUR  Negative 04/09/2020 1113    Lab Results  Component Value Date   LABMICR See below: 04/09/2020   WBCUA 0-5 04/09/2020   LABEPIT None seen 04/09/2020   BACTERIA None seen 04/09/2020    Pertinent Imaging:  No results found for this or any previous visit.  No results found for this or any previous visit.  No results found for this or any previous visit.  No results found for this or any previous visit.  No results found for this or any previous visit.  No results found for this or any previous visit.  No results found for this or any previous visit.  No results found for this or any previous visit.   Assessment & Plan:    1. Urinary frequency -We will trial gemtesa 75mg  daily - POCT urinalysis dipstick - BLADDER SCAN AMB NON-IMAGING  2. Benign prostatic hyperplasia with urinary obstruction -continue rapaflo  3. Nocturia -Continue rapaflo   No follow-ups on file.  , MD  [facility]

## 2020-05-09 NOTE — Patient Instructions (Signed)

## 2020-05-09 NOTE — Progress Notes (Signed)
Urological Symptom Review  Patient is experiencing the following symptoms: Frequency Burning with urination    Review of Systems  Gastrointestinal (upper)  : Negative for upper GI symptoms  Gastrointestinal (lower) : Negative for lower GI symptoms  Constitutional : Negative for symptoms  Skin: Negative for skin symptoms  Eyes: Negative for eye symptoms  Ear/Nose/Throat : Negative for Ear/Nose/Throat symptoms  Hematologic/Lymphatic: Negative for Hematologic/Lymphatic symptoms  Cardiovascular : Negative for cardiovascular symptoms  Respiratory : Negative for respiratory symptoms  Endocrine: Negative for endocrine symptoms  Musculoskeletal: Negative for musculoskeletal symptoms  Neurological: Negative for neurological symptoms  Psychologic: Negative for psychiatric symptoms

## 2020-06-09 ENCOUNTER — Encounter: Payer: Self-pay | Admitting: Urology

## 2020-06-09 ENCOUNTER — Ambulatory Visit (INDEPENDENT_AMBULATORY_CARE_PROVIDER_SITE_OTHER): Payer: Self-pay | Admitting: Urology

## 2020-06-09 ENCOUNTER — Other Ambulatory Visit: Payer: Self-pay

## 2020-06-09 VITALS — BP 164/72 | HR 68

## 2020-06-09 DIAGNOSIS — N401 Enlarged prostate with lower urinary tract symptoms: Secondary | ICD-10-CM

## 2020-06-09 DIAGNOSIS — N138 Other obstructive and reflux uropathy: Secondary | ICD-10-CM

## 2020-06-09 DIAGNOSIS — R350 Frequency of micturition: Secondary | ICD-10-CM

## 2020-06-09 LAB — URINALYSIS, ROUTINE W REFLEX MICROSCOPIC
Bilirubin, UA: NEGATIVE
Glucose, UA: NEGATIVE
Ketones, UA: NEGATIVE
Leukocytes,UA: NEGATIVE
Nitrite, UA: NEGATIVE
Protein,UA: NEGATIVE
RBC, UA: NEGATIVE
Specific Gravity, UA: 1.025 (ref 1.005–1.030)
Urobilinogen, Ur: 0.2 mg/dL (ref 0.2–1.0)
pH, UA: 7.5 (ref 5.0–7.5)

## 2020-06-09 MED ORDER — SILODOSIN 8 MG PO CAPS: 8.0000 mg | ORAL_CAPSULE | Freq: Every day | ORAL | 3 refills | Status: AC

## 2020-06-09 MED ORDER — GEMTESA 75 MG PO TABS: 1.0000 | ORAL_TABLET | Freq: Every day | ORAL | 0 refills | Status: AC

## 2020-06-09 MED ORDER — GEMTESA 75 MG PO TABS: 1.0000 | ORAL_TABLET | Freq: Every day | ORAL | 3 refills | Status: AC

## 2020-06-09 NOTE — Patient Instructions (Signed)

## 2020-06-09 NOTE — Progress Notes (Signed)

## 2020-06-09 NOTE — Progress Notes (Signed)
06/09/2020 4:21 PM   Charles Cantu 08-17-1941 [id number]  Referring provider: Health, [facility] [address] [place],  [place] [address]  Followup BPH and urinary frequency  HPI: Charles Cantu is a 78yo here for followup for BPh and urinary frequency. Last visit he was started on mirabegron which failed to improve his urinary frequency. He urinates every 45-60 minutes. Nocturia has worsened since last visit to 3-4x. He stopped his rapaflo by mistake since last visit. Stream is weak. He is straining to urinate. No dysuria or hematuria. He has worsening urgency but no urge incontinence   PMH: Past Medical History:  Diagnosis Date  . Benign prostatic hypertrophy   . Elevated PSA   . Essential hypertension   . Syncope, near   . Urinary frequency   . Vertigo     Surgical History: Past Surgical History:  Procedure Laterality Date  . Cataract surgery Right 2012  . INGUINAL HERNIA REPAIR Bilateral 2012  . KNEE ASPIRATION Right 2012  . PROSTATE BIOPSY      Home Medications:  Allergies as of 06/09/2020   No Known Allergies     Medication List       Accurate as of June 09, 2020  4:21 PM. If you have any questions, ask your nurse or doctor.        STOP taking these medications   alfuzosin 10 MG 24 hr tablet Commonly known as: UROXATRAL Stopped by: Wilkie Aye, MD   finasteride 5 MG tablet Commonly known as: PROSCAR Stopped by: Wilkie Aye, MD   mirabegron ER 25 MG Tb24 tablet Commonly known as: MYRBETRIQ Stopped by: Wilkie Aye, MD   silodosin 8 MG Caps capsule Commonly known as: RAPAFLO Stopped by: Wilkie Aye, MD   Vascepa 1 g capsule Generic drug: icosapent Ethyl Stopped by: Wilkie Aye, MD     TAKE these medications   atorvastatin 80 MG tablet Commonly known as: LIPITOR Take 80 mg by mouth daily.   fenofibrate 145 MG tablet Commonly known as: TRICOR Take 145 mg by mouth daily.   Gemtesa 75 MG  Tabs Generic drug: Vibegron Take 1 capsule by mouth daily.       Allergies: No Known Allergies  Family History: Family History  Family history unknown: Yes    Social History:  reports that he has never smoked. He has never used smokeless tobacco. He reports current alcohol use. He reports that he does not use drugs.  ROS: All other review of systems were reviewed and are negative except what is noted above in HPI  Physical Exam: BP (!) 164/72   Pulse 68   Constitutional:  Alert and oriented, No acute distress. HEENT: [place] AT, moist mucus membranes.  Trachea midline, no masses. Cardiovascular: No clubbing, cyanosis, or edema. Respiratory: Normal respiratory effort, no increased work of breathing. GI: Abdomen is soft, nontender, nondistended, no abdominal masses GU: No CVA tenderness.  Lymph: No cervical or inguinal lymphadenopathy. Skin: No rashes, bruises or suspicious lesions. Neurologic: Grossly intact, no focal deficits, moving all 4 extremities. Psychiatric: Normal mood and affect.  Laboratory Data: Lab Results  Component Value Date   WBC 9.1 08/26/2015   HGB 12.2 (L) 08/26/2015   HCT 35.8 (L) 08/26/2015   MCV 85.6 08/26/2015   PLT 211 08/26/2015    Lab Results  Component Value Date   CREATININE 0.77 08/26/2015    Lab Results  Component Value Date   PSA 3.8 01/16/2020    No results found for: TESTOSTERONE  No results found for: HGBA1C  Urinalysis    Component Value Date/Time   COLORURINE YELLOW 08/23/2015 2330   APPEARANCEUR Clear 06/09/2020 1534   LABSPEC 1.025 08/23/2015 2330   PHURINE 6.0 08/23/2015 2330   GLUCOSEU Negative 06/09/2020 1534   HGBUR LARGE (A) 08/23/2015 2330   BILIRUBINUR Negative 06/09/2020 1534   KETONESUR TRACE (A) 08/23/2015 2330   PROTEINUR Negative 06/09/2020 1534   PROTEINUR TRACE (A) 08/23/2015 2330   UROBILINOGEN 0.2 05/09/2020 1005   NITRITE Negative 06/09/2020 1534   NITRITE NEGATIVE 08/23/2015 2330   LEUKOCYTESUR  Negative 06/09/2020 1534    Lab Results  Component Value Date   LABMICR Comment 06/09/2020   WBCUA 0-5 04/09/2020   LABEPIT None seen 04/09/2020   BACTERIA None seen 04/09/2020    Pertinent Imaging:  No results found for this or any previous visit.  No results found for this or any previous visit.  No results found for this or any previous visit.  No results found for this or any previous visit.  No results found for this or any previous visit.  No results found for this or any previous visit.  No results found for this or any previous visit.  No results found for this or any previous visit.   Assessment & Plan:    1. Urinary frequency Gemtesa 75mg  daily - Urinalysis, Routine w reflex microscopic  2. Benign prostatic hyperplasia with urinary obstruction -restart rapaflo 8mg     No follow-ups on file.  , MD  [facility] [place]

## 2020-09-10 ENCOUNTER — Other Ambulatory Visit: Payer: Self-pay

## 2020-09-10 ENCOUNTER — Ambulatory Visit (INDEPENDENT_AMBULATORY_CARE_PROVIDER_SITE_OTHER): Payer: Self-pay | Admitting: Urology

## 2020-09-10 ENCOUNTER — Encounter: Payer: Self-pay | Admitting: Urology

## 2020-09-10 VITALS — BP 162/73 | HR 71 | Temp 98.2°F | Ht 61.0 in | Wt 150.0 lb

## 2020-09-10 DIAGNOSIS — R350 Frequency of micturition: Secondary | ICD-10-CM

## 2020-09-10 DIAGNOSIS — R351 Nocturia: Secondary | ICD-10-CM

## 2020-09-10 DIAGNOSIS — N138 Other obstructive and reflux uropathy: Secondary | ICD-10-CM

## 2020-09-10 DIAGNOSIS — N401 Enlarged prostate with lower urinary tract symptoms: Secondary | ICD-10-CM

## 2020-09-10 LAB — URINALYSIS, ROUTINE W REFLEX MICROSCOPIC
Bilirubin, UA: NEGATIVE
Glucose, UA: NEGATIVE
Ketones, UA: NEGATIVE
Leukocytes,UA: NEGATIVE
Nitrite, UA: NEGATIVE
Specific Gravity, UA: 1.02 (ref 1.005–1.030)
Urobilinogen, Ur: 0.2 mg/dL (ref 0.2–1.0)
pH, UA: 8.5 — ABNORMAL HIGH (ref 5.0–7.5)

## 2020-09-10 LAB — MICROSCOPIC EXAMINATION
Bacteria, UA: NONE SEEN
Epithelial Cells (non renal): NONE SEEN /hpf (ref 0–10)
Renal Epithel, UA: NONE SEEN /hpf
WBC, UA: NONE SEEN /hpf (ref 0–5)

## 2020-09-10 LAB — BLADDER SCAN AMB NON-IMAGING: Scan Result: 35

## 2020-09-10 NOTE — Patient Instructions (Signed)

## 2020-09-10 NOTE — Progress Notes (Signed)
Bladder Scan Patient can void: 35 ml Performed By: Amayia Ciano,lpn    Urological Symptom Review  Patient is experiencing the following symptoms: Get up at night x 4   Review of Systems  Gastrointestinal (upper)  : Negative for upper GI symptoms  Gastrointestinal (lower) : Negative for lower GI symptoms  Constitutional : Negative for symptoms  Skin: Negative for skin symptoms  Eyes: Double vision  Ear/Nose/Throat : Negative for Ear/Nose/Throat symptoms  Hematologic/Lymphatic: Negative for Hematologic/Lymphatic symptoms  Cardiovascular : Negative for cardiovascular symptoms  Respiratory : Negative for respiratory symptoms  Endocrine: Negative for endocrine symptoms  Musculoskeletal: Negative for musculoskeletal symptoms  Neurological: Negative for neurological symptoms  Psychologic: Negative for psychiatric symptoms

## 2020-09-10 NOTE — Progress Notes (Signed)
09/10/2020 4:00 PM   Cache Bills 01-23-1941 [id number]  Referring provider: Health, [facility] [address] [place],  [place] [address]  Followup BPH  HPI: Mr Charles Cantu is a 80yo here for followup for BPH with nocturia. Last visit we restarted rapaflo and we tried gemtesa. The rapaflo 8mg  improved his stream. He denies any urinary frequency or urgency on the rapaflo. He has nocturia 3-4x until 4am and then urinates every 20-30 minutes until 7am. Small volume voids. 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    PMH: Past Medical History:  Diagnosis Date  . Benign prostatic hypertrophy   . Elevated PSA   . Essential hypertension   . Syncope, near   . Urinary frequency   . Vertigo     Surgical History: Past Surgical History:  Procedure Laterality Date  . Cataract surgery Right 2012  . INGUINAL HERNIA REPAIR Bilateral 2012  . KNEE ASPIRATION Right 2012  . PROSTATE BIOPSY      Home Medications:  Allergies as of 09/10/2020   No Known Allergies     Medication List       Accurate as of September 10, 2020  4:00 PM. If you have any questions, ask your nurse or doctor.        atorvastatin 80 MG tablet Commonly known as: LIPITOR Take 80 mg by mouth daily.   fenofibrate 145 MG tablet Commonly known as: TRICOR Take 145 mg by mouth daily.   Gemtesa 75 MG Tabs Generic drug: Vibegron Take 1 capsule by mouth daily.   Gemtesa 75 MG Tabs Generic drug: Vibegron Take 1 capsule by mouth daily.   silodosin 8 MG Caps capsule Commonly known as: RAPAFLO Take 1 capsule (8 mg total) by mouth daily with breakfast.       Allergies: No Known Allergies  Family History: Family History  Family history unknown: Yes    Social History:  reports that he has never smoked. He has never used smokeless tobacco. He reports current alcohol use. He reports that he does not use drugs.  ROS: All other review of systems were reviewed and are negative except what is  noted above in HPI  Physical Exam: BP (!) 162/73   Pulse 71   Temp 98.2 F (36.8 C)   Ht 5\' 1"  (1.549 m)   Wt 150 lb (68 kg)   BMI 28.34 kg/m   Constitutional:  Alert and oriented, No acute distress. HEENT: [place] AT, moist mucus membranes.  Trachea midline, no masses. Cardiovascular: No clubbing, cyanosis, or edema. Respiratory: Normal respiratory effort, no increased work of breathing. GI: Abdomen is soft, nontender, nondistended, no abdominal masses GU: No CVA tenderness.  Lymph: No cervical or inguinal lymphadenopathy. Skin: No rashes, bruises or suspicious lesions. Neurologic: Grossly intact, no focal deficits, moving all 4 extremities. Psychiatric: Normal mood and affect.  Laboratory Data: Lab Results  Component Value Date   WBC 9.1 08/26/2015   HGB 12.2 (L) 08/26/2015   HCT 35.8 (L) 08/26/2015   MCV 85.6 08/26/2015   PLT 211 08/26/2015    Lab Results  Component Value Date   CREATININE 0.77 08/26/2015    Lab Results  Component Value Date   PSA 3.8 01/16/2020    No results found for: TESTOSTERONE  No results found for: HGBA1C  Urinalysis    Component Value Date/Time   COLORURINE YELLOW 08/23/2015 2330   APPEARANCEUR Clear 06/09/2020 1534   LABSPEC 1.025 08/23/2015 2330   PHURINE 6.0 08/23/2015 2330  GLUCOSEU Negative 06/09/2020 1534   HGBUR LARGE (A) 08/23/2015 2330   BILIRUBINUR Negative 06/09/2020 1534   KETONESUR TRACE (A) 08/23/2015 2330   PROTEINUR Negative 06/09/2020 1534   PROTEINUR TRACE (A) 08/23/2015 2330   UROBILINOGEN 0.2 05/09/2020 1005   NITRITE Negative 06/09/2020 1534   NITRITE NEGATIVE 08/23/2015 2330   LEUKOCYTESUR Negative 06/09/2020 1534    Lab Results  Component Value Date   LABMICR Comment 06/09/2020   WBCUA 0-5 04/09/2020   LABEPIT None seen 04/09/2020   BACTERIA None seen 04/09/2020    Pertinent Imaging:  No results found for this or any previous visit.  No results found for this or any previous visit.  No  results found for this or any previous visit.  No results found for this or any previous visit.  No results found for this or any previous visit.  No results found for this or any previous visit.  No results found for this or any previous visit.  No results found for this or any previous visit.   Assessment & Plan:    1. Urinary frequency -we will await cimfirmation form the patient ophthalmologist prior to starting anticholinergics - Urinalysis, Routine w reflex microscopic - BLADDER SCAN AMB NON-IMAGING  2. Benign prostatic hyperplasia with urinary obstruction Continue rapafo 8mg   3. Nocturia -Patient to obtain clearance to take anticholinergics from his opthalmologic    No follow-ups on file.  , MD  [facility] [place]

## 2020-11-11 ENCOUNTER — Telehealth: Payer: Self-pay

## 2020-11-12 ENCOUNTER — Other Ambulatory Visit: Payer: Self-pay

## 2020-11-12 DIAGNOSIS — R350 Frequency of micturition: Secondary | ICD-10-CM

## 2020-11-12 DIAGNOSIS — R351 Nocturia: Secondary | ICD-10-CM

## 2020-11-12 MED ORDER — SOLIFENACIN SUCCINATE 5 MG PO TABS: 5.0000 mg | ORAL_TABLET | Freq: Every day | ORAL | 11 refills | Status: DC

## 2020-11-14 NOTE — Telephone Encounter (Signed)
Order for vesicare sent inf

## 2020-12-17 ENCOUNTER — Ambulatory Visit: Payer: Self-pay | Admitting: Urology

## 2021-03-26 ENCOUNTER — Other Ambulatory Visit: Payer: Self-pay

## 2021-03-26 ENCOUNTER — Emergency Department (HOSPITAL_COMMUNITY)
Admission: EM | Admit: 2021-03-26 | Discharge: 2021-03-27 | Disposition: A | Discharge status: Home / Self Care | Payer: Self-pay | Attending: Emergency Medicine | Admitting: Emergency Medicine

## 2021-03-26 ENCOUNTER — Encounter (HOSPITAL_COMMUNITY): Payer: Self-pay | Admitting: Emergency Medicine

## 2021-03-26 DIAGNOSIS — Z87438 Personal history of other diseases of male genital organs: Secondary | ICD-10-CM | POA: Insufficient documentation

## 2021-03-26 DIAGNOSIS — R310 Gross hematuria: Secondary | ICD-10-CM | POA: Insufficient documentation

## 2021-03-26 DIAGNOSIS — I10 Essential (primary) hypertension: Secondary | ICD-10-CM | POA: Insufficient documentation

## 2021-03-26 LAB — CBC WITH DIFFERENTIAL/PLATELET
Abs Immature Granulocytes: 0.02 10*3/uL (ref 0.00–0.07)
Basophils Absolute: 0.1 10*3/uL (ref 0.0–0.1)
Basophils Relative: 1 %
Eosinophils Absolute: 0.1 10*3/uL (ref 0.0–0.5)
Eosinophils Relative: 2 %
HCT: 47.2 % (ref 39.0–52.0)
Hemoglobin: 15.1 g/dL (ref 13.0–17.0)
Immature Granulocytes: 0 %
Lymphocytes Relative: 17 %
Lymphs Abs: 1.2 10*3/uL (ref 0.7–4.0)
MCH: 28.7 pg (ref 26.0–34.0)
MCHC: 32 g/dL (ref 30.0–36.0)
MCV: 89.7 fL (ref 80.0–100.0)
Monocytes Absolute: 0.5 10*3/uL (ref 0.1–1.0)
Monocytes Relative: 7 %
Neutro Abs: 5.2 10*3/uL (ref 1.7–7.7)
Neutrophils Relative %: 73 %
Platelets: 255 10*3/uL (ref 150–400)
RBC: 5.26 MIL/uL (ref 4.22–5.81)
RDW: 13.2 % (ref 11.5–15.5)
WBC: 7.1 10*3/uL (ref 4.0–10.5)
nRBC: 0 % (ref 0.0–0.2)

## 2021-03-26 LAB — URINALYSIS, MICROSCOPIC (REFLEX): RBC / HPF: >50 RBC/hpf (ref 0–5)

## 2021-03-26 LAB — URINALYSIS, ROUTINE W REFLEX MICROSCOPIC

## 2021-03-26 LAB — BASIC METABOLIC PANEL
Anion gap: 9 (ref 5–15)
BUN: 14 mg/dL (ref 8–23)
CO2: 26 mmol/L (ref 22–32)
Calcium: 9.3 mg/dL (ref 8.9–10.3)
Chloride: 102 mmol/L (ref 98–111)
Creatinine, Ser: 0.76 mg/dL (ref 0.61–1.24)
GFR, Estimated: >60 mL/min (ref >60)
Glucose, Bld: 113 mg/dL — ABNORMAL HIGH (ref 70–99)
Potassium: 4.1 mmol/L (ref 3.5–5.1)
Sodium: 137 mmol/L (ref 135–145)

## 2021-03-26 LAB — PROTIME-INR
INR: 1.1 (ref 0.8–1.2)
Prothrombin Time: 13.9 seconds (ref 11.4–15.2)

## 2021-03-26 NOTE — ED Provider Notes (Signed)
Emergency Medicine Provider Triage Evaluation Note  Charles Cantu , a 80 y.o. male  was evaluated in triage.  Pt complains of blood in urine, going on since the fifth of this month, states it stopped for about a week but started back up again, states he has some blood in his urine not as much as it was in the past, he also notes that he is obtaining his bladder, he denies any back pain, nausea, vomiting, diarrhea, no history of bladder cancer, prostate cancer, has not seen anyone for this problem.  ". Not on anticoagulants  Review of Systems  Positive: Bladder pain, hematuri Negative: Abdominal pain, nausea  Physical Exam  BP (!) 166/84 (BP Location: Left Arm)   Pulse 66   Temp 98.5 F (36.9 C) (Oral)   Resp 17   SpO2 95%  Gen:   Awake, no distress   Resp:  Normal effort  MSK:   Moves extremities without difficulty  Other:    Medical Decision Making  Medically screening exam initiated at 5:18 PM.  Appropriate orders placed.  Charles Cantu was informed that the remainder of the evaluation will be completed by another provider, this initial triage assessment does not replace that evaluation, and the importance of remaining in the ED until their evaluation is complete.  Presents with hematuria, patient will need further work-up.   Charles Sage, PA-C 03/26/21 1721    Mancel Bale, MD 03/27/21 [phone]

## 2021-03-26 NOTE — ED Triage Notes (Signed)
Pt c/o blood in his urine since 7/5, denies back pain, c/o bladder pain. Hx hernia, denies back pain, nausea/vomiting/diarrhea.

## 2021-03-27 NOTE — Discharge Instructions (Addendum)
Es importante llamar al Dr. Ronne Binning (urologa) para programar una cita para un mayor control de la orina con [place]. (It is important to call Dr. Ronne Binning (urology) to schedule an appointment for further management of bloody urine.)  Regrese al departamento de emergencias si tiene alguna dificultad para orinar, fiebre o dolor. (Return to the emergency department if you have any difficulty passing urine, have any fever, have any pain.)

## 2021-03-27 NOTE — ED Provider Notes (Signed)
[facility] EMERGENCY DEPARTMENT Provider Note   CSN: [id number] Arrival date & time: 03/26/21  1526     History Chief Complaint  Patient presents with   Hematuria    Charles Cantu is a 80 y.o. male.  Patient to ED for evaluation of hematuria. He reports that it started 6 nights ago, resolved the following day, then returned yesterday. He states he saw more blood yesterday than today. No fever. No pain with urination, abdominal pain or testicular pain. History of prostatitis. He reports urinary frequency but no difficulty emptying his bladder.   The history is provided by the patient. A language interpreter was used.  Hematuria Pertinent negatives include no chest pain, no abdominal pain and no shortness of breath.      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 Authorizing Provider  atorvastatin (LIPITOR) 80 MG tablet Take 80 mg by mouth daily. 01/09/20   [provider]  fenofibrate (TRICOR) 145 MG tablet Take 145 mg by mouth daily. 04/10/20   [provider]  silodosin (RAPAFLO) 8 MG CAPS capsule Take 1 capsule (8 mg total) by mouth daily with breakfast. 06/09/20   McKenzie, Mardene Celeste, MD  solifenacin (VESICARE) 5 MG tablet Take 1 tablet (5 mg total) by mouth daily. 11/12/20   McKenzie, Mardene Celeste, MD  Vibegron (GEMTESA) 75 MG TABS Take 1 capsule by mouth daily. 06/09/20   McKenzie, Mardene Celeste, MD  Vibegron (GEMTESA) 75 MG TABS Take 1 capsule by mouth daily. 06/09/20   McKenzie, Mardene Celeste, MD    Allergies    Patient has no known allergies.  Review of Systems   Review of Systems  Constitutional:  Negative for fever.  Respiratory:  Negative for shortness of breath.   Cardiovascular:  Negative for chest pain.  Gastrointestinal:  Negative for abdominal pain, nausea and vomiting.  Genitourinary:  Positive for frequency and hematuria. Negative for dysuria, flank pain and testicular pain.  Musculoskeletal:  Negative for back pain.  Neurological:  Negative for weakness.   Physical Exam Updated Vital Signs BP (!) 160/78   Pulse 69   Temp 98.3 F (36.8 C) (Oral)   Resp 18   SpO2 98%   Physical Exam Vitals  and nursing note reviewed.  Constitutional:      Appearance: He is well-developed.  Pulmonary:     Effort: Pulmonary effort is normal.  Abdominal:     General: There is no distension.     Palpations: Abdomen is soft.     Tenderness: There is no abdominal tenderness.     Hernia: No hernia is present.  Genitourinary:    Comments: Uncircumcised penis. No blood visualized. No testicular tenderness.  Musculoskeletal:        General: Normal range of motion.     Cervical back: Normal range of motion.  Skin:    General: Skin is warm and dry.  Neurological:     Mental Status: He is alert and  oriented to person, place, and time.    ED Results / Procedures / Treatments   Labs (all labs ordered are listed, but only abnormal results are displayed) Labs Reviewed  BASIC METABOLIC PANEL - Abnormal; Notable for the following components:      Result Value   Glucose, Bld 113 (*)    All other components within normal limits  URINALYSIS, ROUTINE W REFLEX MICROSCOPIC - Abnormal; Notable for the following components:   Color, Urine RED (*)    APPearance CLOUDY (*)    Glucose, UA   (*)    Value: TEST NOT REPORTED DUE TO COLOR INTERFERENCE OF URINE PIGMENT   Hgb urine dipstick LARGE (*)    Bilirubin Urine   (*)    Value: TEST NOT REPORTED DUE TO COLOR INTERFERENCE OF URINE PIGMENT   Ketones, ur   (*)    Value: TEST NOT REPORTED DUE TO COLOR INTERFERENCE OF URINE PIGMENT   Protein, ur   (*)    Value: TEST NOT REPORTED DUE TO COLOR INTERFERENCE OF URINE PIGMENT   Nitrite   (*)    Value: TEST NOT REPORTED DUE TO COLOR INTERFERENCE OF URINE PIGMENT   Leukocytes,Ua   (*)    Value: TEST NOT REPORTED DUE TO COLOR INTERFERENCE OF URINE PIGMENT   All other components within normal limits  URINALYSIS, MICROSCOPIC (REFLEX) - Abnormal; Notable for the following components:   Bacteria, UA FIELD OBSCURED BY RBC'S (*)    All other components within normal limits  URINE CULTURE  CBC WITH DIFFERENTIAL/PLATELET  PROTIME-INR    EKG None  Radiology No results found.  Procedures Procedures   Medications Ordered in ED Medications - No data to display  ED Course  I have reviewed the triage vital signs and the nursing notes.  Pertinent labs & imaging results that were available during my care of the patient were reviewed by me and considered in my medical decision making (see chart for details).    MDM Rules/Calculators/A&P                           Patient to ED with hematuria. No difficulty urinating. No fever.   UA shows significant blood that obscures evaluation for infection,  although doubtful without fever. No leukocytosis. Normal hemoglobin. Normal kidney function.   The patient has been seen in the past by Dr. Thea Silversmith, urology. He is reassured and strongly encouraged to see Dr. Thea Silversmith for further diagnosis and management. Return precautions discussed.   All history, physical, lab interpretation and discharge instructions discussed via video interpreter. All questions answered.   Final Clinical Impression(s) / ED Diagnoses Final diagnoses:  None   Gross hematuria History of prostate hyperplasia  Rx /  DC Orders ED Discharge Orders     None        Elpidio Anis, PA-C 03/27/21 0135    Melene Plan, DO 03/27/21 6599

## 2021-03-28 LAB — URINE CULTURE: Culture: NO GROWTH

## 2022-02-24 ENCOUNTER — Other Ambulatory Visit: Payer: Self-pay | Admitting: Urology

## 2022-02-24 DIAGNOSIS — R351 Nocturia: Secondary | ICD-10-CM

## 2022-12-22 ENCOUNTER — Ambulatory Visit: Payer: Self-pay | Admitting: Urology

## 2022-12-22 DIAGNOSIS — R310 Gross hematuria: Secondary | ICD-10-CM

## 2024-02-28 ENCOUNTER — Other Ambulatory Visit: Payer: Self-pay
